# Patient Record
Sex: Female | Born: 1995 | Race: Black or African American | Hispanic: No | Marital: Single | Smoking: Current every day smoker
Health system: Southern US, Community
[De-identification: ages and names within clinical notes are randomized; demographics above are authoritative.]

## PROBLEM LIST (undated history)

## (undated) ENCOUNTER — Inpatient Hospital Stay: Payer: Self-pay

---
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
## 2013-05-08 ENCOUNTER — Observation Stay: Payer: Self-pay | Admitting: Obstetrics and Gynecology

## 2013-05-18 ENCOUNTER — Inpatient Hospital Stay: Payer: Self-pay | Admitting: Obstetrics and Gynecology

## 2013-05-18 LAB — DRUG SCREEN, URINE
Amphetamines, Ur Screen: NEGATIVE (ref ?–1000)
Barbiturates, Ur Screen: NEGATIVE (ref ?–200)
Benzodiazepine, Ur Scrn: NEGATIVE (ref ?–200)
Cannabinoid 50 Ng, Ur ~~LOC~~: POSITIVE (ref ?–50)
Cocaine Metabolite,Ur ~~LOC~~: NEGATIVE (ref ?–300)
MDMA (Ecstasy)Ur Screen: NEGATIVE (ref ?–500)
Methadone, Ur Screen: NEGATIVE (ref ?–300)
Opiate, Ur Screen: NEGATIVE (ref ?–300)
Phencyclidine (PCP) Ur S: NEGATIVE (ref ?–25)
Tricyclic, Ur Screen: NEGATIVE (ref ?–1000)

## 2013-05-18 LAB — CBC WITH DIFFERENTIAL/PLATELET
Basophil #: 0 10*3/uL (ref 0.0–0.1)
Basophil %: 0.2 %
Eosinophil #: 0 10*3/uL (ref 0.0–0.7)
Eosinophil %: 0.2 %
HCT: 30.4 % — ABNORMAL LOW (ref 35.0–47.0)
HGB: 9.8 g/dL — ABNORMAL LOW (ref 12.0–16.0)
Lymphocyte #: 1.6 10*3/uL (ref 1.0–3.6)
Lymphocyte %: 19.8 %
MCH: 27.1 pg (ref 26.0–34.0)
MCHC: 32.3 g/dL (ref 32.0–36.0)
MCV: 84 fL (ref 80–100)
Monocyte #: 0.6 x10 3/mm (ref 0.2–0.9)
Monocyte %: 8.2 %
Neutrophil #: 5.6 10*3/uL (ref 1.4–6.5)
Neutrophil %: 71.6 %
Platelet: 218 10*3/uL (ref 150–440)
RBC: 3.63 10*6/uL — ABNORMAL LOW (ref 3.80–5.20)
RDW: 14.4 % (ref 11.5–14.5)
WBC: 7.8 10*3/uL (ref 3.6–11.0)

## 2013-05-18 LAB — GC/CHLAMYDIA PROBE AMP

## 2013-05-19 LAB — HEMOGLOBIN: HGB: 8 g/dL — ABNORMAL LOW (ref 12.0–16.0)

## 2014-02-24 ENCOUNTER — Emergency Department: Payer: Self-pay | Admitting: Emergency Medicine

## 2014-02-25 ENCOUNTER — Emergency Department: Payer: Self-pay | Admitting: Emergency Medicine

## 2014-05-03 ENCOUNTER — Emergency Department: Payer: Self-pay | Admitting: Student

## 2014-06-09 LAB — OB RESULTS CONSOLE RUBELLA ANTIBODY, IGM: Rubella: IMMUNE

## 2014-06-09 LAB — OB RESULTS CONSOLE GC/CHLAMYDIA
Chlamydia: NEGATIVE
Gonorrhea: NEGATIVE

## 2014-06-09 LAB — OB RESULTS CONSOLE HIV ANTIBODY (ROUTINE TESTING): HIV: NONREACTIVE

## 2014-06-09 LAB — OB RESULTS CONSOLE ABO/RH: RH Type: POSITIVE

## 2014-06-09 LAB — OB RESULTS CONSOLE VARICELLA ZOSTER ANTIBODY, IGG: Varicella: IMMUNE

## 2014-06-09 LAB — OB RESULTS CONSOLE HEPATITIS B SURFACE ANTIGEN: Hepatitis B Surface Ag: NEGATIVE

## 2014-06-09 LAB — OB RESULTS CONSOLE ANTIBODY SCREEN: Antibody Screen: NEGATIVE

## 2014-06-15 ENCOUNTER — Ambulatory Visit
Admit: 2014-06-15 | Disposition: A | Discharge status: Home / Self Care | Payer: Self-pay | Attending: Family Medicine | Admitting: Family Medicine

## 2014-07-23 ENCOUNTER — Encounter: Payer: Self-pay | Admitting: Medical Oncology

## 2014-07-23 ENCOUNTER — Inpatient Hospital Stay
Admission: EM | Admit: 2014-07-23 | Discharge: 2014-07-28 | DRG: 781 | Disposition: A | Discharge status: Home / Self Care | Payer: Medicaid Other | Attending: Obstetrics and Gynecology | Admitting: Obstetrics and Gynecology

## 2014-07-23 ENCOUNTER — Emergency Department: Payer: Medicaid Other

## 2014-07-23 DIAGNOSIS — F1210 Cannabis abuse, uncomplicated: Secondary | ICD-10-CM | POA: Diagnosis present

## 2014-07-23 DIAGNOSIS — N12 Tubulo-interstitial nephritis, not specified as acute or chronic: Secondary | ICD-10-CM | POA: Diagnosis present

## 2014-07-23 DIAGNOSIS — O99012 Anemia complicating pregnancy, second trimester: Secondary | ICD-10-CM | POA: Diagnosis present

## 2014-07-23 DIAGNOSIS — O2302 Infections of kidney in pregnancy, second trimester: Secondary | ICD-10-CM | POA: Diagnosis not present

## 2014-07-23 DIAGNOSIS — O98212 Gonorrhea complicating pregnancy, second trimester: Secondary | ICD-10-CM | POA: Diagnosis present

## 2014-07-23 DIAGNOSIS — Z3490 Encounter for supervision of normal pregnancy, unspecified, unspecified trimester: Secondary | ICD-10-CM

## 2014-07-23 DIAGNOSIS — B9620 Unspecified Escherichia coli [E. coli] as the cause of diseases classified elsewhere: Secondary | ICD-10-CM | POA: Diagnosis present

## 2014-07-23 DIAGNOSIS — Z331 Pregnant state, incidental: Secondary | ICD-10-CM | POA: Diagnosis not present

## 2014-07-23 DIAGNOSIS — O99322 Drug use complicating pregnancy, second trimester: Secondary | ICD-10-CM | POA: Diagnosis present

## 2014-07-23 DIAGNOSIS — Z3A24 24 weeks gestation of pregnancy: Secondary | ICD-10-CM | POA: Diagnosis present

## 2014-07-23 LAB — CBC WITH DIFFERENTIAL/PLATELET
Basophils Absolute: 0 10*3/uL (ref 0–0.1)
Basophils Relative: 0 %
Eosinophils Absolute: 0 10*3/uL (ref 0–0.7)
Eosinophils Relative: 0 %
HCT: 31.3 % — ABNORMAL LOW (ref 35.0–47.0)
Hemoglobin: 10 g/dL — ABNORMAL LOW (ref 12.0–16.0)
Lymphocytes Relative: 3 %
Lymphs Abs: 0.5 10*3/uL — ABNORMAL LOW (ref 1.0–3.6)
MCH: 26.6 pg (ref 26.0–34.0)
MCHC: 32 g/dL (ref 32.0–36.0)
MCV: 83 fL (ref 80.0–100.0)
Monocytes Absolute: 1 10*3/uL — ABNORMAL HIGH (ref 0.2–0.9)
Monocytes Relative: 6 %
Neutro Abs: 15.7 10*3/uL — ABNORMAL HIGH (ref 1.4–6.5)
Neutrophils Relative %: 91 %
Platelets: 210 10*3/uL (ref 150–440)
RBC: 3.77 MIL/uL — ABNORMAL LOW (ref 3.80–5.20)
RDW: 13.5 % (ref 11.5–14.5)
WBC: 17.4 10*3/uL — ABNORMAL HIGH (ref 3.6–11.0)

## 2014-07-23 LAB — URINALYSIS COMPLETE WITH MICROSCOPIC (ARMC ONLY)
Bilirubin Urine: NEGATIVE
Glucose, UA: 50 mg/dL — AB
Ketones, ur: NEGATIVE mg/dL
Nitrite: NEGATIVE
Protein, ur: 30 mg/dL — AB
Specific Gravity, Urine: 1.004 — ABNORMAL LOW (ref 1.005–1.030)
pH: 7 (ref 5.0–8.0)

## 2014-07-23 LAB — COMPREHENSIVE METABOLIC PANEL
ALT: 11 U/L — ABNORMAL LOW (ref 14–54)
AST: 21 U/L (ref 15–41)
Albumin: 2.9 g/dL — ABNORMAL LOW (ref 3.5–5.0)
Alkaline Phosphatase: 122 U/L (ref 38–126)
Anion gap: 10 (ref 5–15)
BUN: 5 mg/dL — ABNORMAL LOW (ref 6–20)
CO2: 22 mmol/L (ref 22–32)
Calcium: 8.6 mg/dL — ABNORMAL LOW (ref 8.9–10.3)
Chloride: 96 mmol/L — ABNORMAL LOW (ref 101–111)
Creatinine, Ser: 0.8 mg/dL (ref 0.44–1.00)
GFR calc Af Amer: >60 mL/min (ref >60)
GFR calc non Af Amer: >60 mL/min (ref >60)
Glucose, Bld: 142 mg/dL — ABNORMAL HIGH (ref 65–99)
Potassium: 3.1 mmol/L — ABNORMAL LOW (ref 3.5–5.1)
Sodium: 128 mmol/L — ABNORMAL LOW (ref 135–145)
Total Bilirubin: 0.7 mg/dL (ref 0.3–1.2)
Total Protein: 7.6 g/dL (ref 6.5–8.1)

## 2014-07-23 LAB — LACTIC ACID, PLASMA: Lactic Acid, Venous: 1.9 mmol/L (ref 0.5–2.0)

## 2014-07-23 MED ORDER — FENTANYL CITRATE (PF) 100 MCG/2ML IJ SOLN
50.0000 ug | Freq: Once | INTRAMUSCULAR | Status: AC
  Administered 2014-07-23: 50 ug via INTRAVENOUS

## 2014-07-23 MED ORDER — ACETAMINOPHEN 325 MG PO TABS
ORAL_TABLET | ORAL | Status: AC
  Filled 2014-07-23: qty 2

## 2014-07-23 MED ORDER — POTASSIUM CHLORIDE CRYS ER 20 MEQ PO TBCR
40.0000 meq | EXTENDED_RELEASE_TABLET | Freq: Once | ORAL | Status: AC
  Administered 2014-07-23: 40 meq via ORAL

## 2014-07-23 MED ORDER — CEFTRIAXONE SODIUM IN DEXTROSE 20 MG/ML IV SOLN
INTRAVENOUS | Status: AC
  Administered 2014-07-23: 2000 mg
  Filled 2014-07-23: qty 100

## 2014-07-23 MED ORDER — FENTANYL CITRATE (PF) 100 MCG/2ML IJ SOLN
INTRAMUSCULAR | Status: AC
  Administered 2014-07-23: 50 ug via INTRAVENOUS
  Filled 2014-07-23: qty 2

## 2014-07-23 MED ORDER — CEFTRIAXONE SODIUM IN DEXTROSE 40 MG/ML IV SOLN: 2.0000 g | Freq: Once | INTRAVENOUS | Status: AC

## 2014-07-23 MED ORDER — SODIUM CHLORIDE 0.9 % IV SOLN
Freq: Once | INTRAVENOUS | Status: AC
  Administered 2014-07-23: 17:00:00 via INTRAVENOUS

## 2014-07-23 MED ORDER — ACETAMINOPHEN 325 MG PO TABS
650.0000 mg | ORAL_TABLET | Freq: Four times a day (QID) | ORAL | Status: DC | PRN
  Administered 2014-07-23 – 2014-07-27 (×6): 650 mg via ORAL
  Filled 2014-07-23 (×4): qty 2

## 2014-07-23 MED ORDER — PRENATAL MULTIVITAMIN CH
1.0000 | ORAL_TABLET | Freq: Every day | ORAL | Status: DC
  Administered 2014-07-24 – 2014-07-27 (×3): 1 via ORAL
  Filled 2014-07-23 (×3): qty 1

## 2014-07-23 MED ORDER — HYDROMORPHONE HCL 1 MG/ML IJ SOLN
1.0000 mg | Freq: Once | INTRAMUSCULAR | Status: AC
  Administered 2014-07-23: 1 mg via INTRAVENOUS

## 2014-07-23 MED ORDER — ACETAMINOPHEN 500 MG PO TABS
ORAL_TABLET | ORAL | Status: AC
  Administered 2014-07-23: 1000 mg via ORAL
  Filled 2014-07-23: qty 2

## 2014-07-23 MED ORDER — HYDROMORPHONE HCL 1 MG/ML IJ SOLN
INTRAMUSCULAR | Status: AC
  Administered 2014-07-23: 1 mg via INTRAVENOUS
  Filled 2014-07-23: qty 1

## 2014-07-23 MED ORDER — ACETAMINOPHEN 500 MG PO TABS
1000.0000 mg | ORAL_TABLET | Freq: Once | ORAL | Status: AC
  Administered 2014-07-23 – 2014-07-24 (×2): 1000 mg via ORAL

## 2014-07-23 MED ORDER — DOCUSATE SODIUM 100 MG PO CAPS
100.0000 mg | ORAL_CAPSULE | Freq: Two times a day (BID) | ORAL | Status: DC | PRN
  Administered 2014-07-25 – 2014-07-27 (×6): 100 mg via ORAL
  Filled 2014-07-23 (×7): qty 1

## 2014-07-23 MED ORDER — POTASSIUM CHLORIDE CRYS ER 20 MEQ PO TBCR
EXTENDED_RELEASE_TABLET | ORAL | Status: AC
  Administered 2014-07-23: 40 meq via ORAL
  Filled 2014-07-23: qty 2

## 2014-07-23 MED ORDER — CALCIUM CARBONATE ANTACID 500 MG PO CHEW
2.0000 | CHEWABLE_TABLET | ORAL | Status: DC | PRN
  Administered 2014-07-24 – 2014-07-25 (×4): 400 mg via ORAL
  Filled 2014-07-23 (×4): qty 2

## 2014-07-23 MED ORDER — POTASSIUM CHLORIDE 2 MEQ/ML IV SOLN
INTRAVENOUS | Status: DC
  Administered 2014-07-23 – 2014-07-26 (×8): via INTRAVENOUS
  Filled 2014-07-23 (×18): qty 1000

## 2014-07-23 NOTE — ED Notes (Signed)
Patient transported to Ultrasound 

## 2014-07-23 NOTE — ED Provider Notes (Signed)
[facility] Emergency Department Provider Note  ____________________________________________  Time seen: 1615  I have reviewed the triage vital signs and the nursing notes.   HISTORY  Chief Complaint Flank Pain and Fever    HPI Alicia Costa is a 19 y.o. female who is [redacted] weeks pregnant. She began having pain in her right flank 2-3 days ago. She is having fevers. The pain is getting worse and worse and is now rather severe.  She denies any abdominal pain. [facility]. She does complain of diffuse myalgias and pain in her lower extremities.    She has report having had simple UTIs in the past but never a kidney infection.    History reviewed. No pertinent past medical history.  There are no active problems to display for this patient.   History reviewed. No pertinent past surgical history.  No current outpatient prescriptions on file.  Allergies Review of patient's allergies indicates no known allergies.  No family history on file.  Social History History  Substance Use Topics  . Smoking status: Never Smoker   . Smokeless tobacco: Not on file  . Alcohol Use: No    Review of Systems  Constitutional: Positive for fever. ENT: Negative for sore throat. Cardiovascular: Negative for chest pain. Respiratory: Negative for shortness of breath. Gastrointestinal: Negative for abdominal pain, vomiting and diarrhea. Genitourinary: No dysuria. Patient is gravid at 24 weeks. See history of present illness. Musculoskeletal: Positive for right flank pain. She does have myalgias in her legs. See history of present illness Skin: Negative for rash. Neurological: Negative for headaches   10-point ROS otherwise negative.  ____________________________________________   PHYSICAL EXAM:  VITAL SIGNS: ED Triage Vitals  Enc Vitals Group     BP 07/23/14 1545 111/61 mmHg     Pulse Rate 07/23/14 1545 138   Resp 07/23/14 1545 18     Temp 07/23/14 1545 102.7 F (39.3 C)     Temp Source 07/23/14 1545 Oral     SpO2 07/23/14 1545 99 %     Weight 07/23/14 1545 154 lb (69.854 kg)     Height 07/23/14 1545 5\' 8"  (1.727 m)     Head Cir --      Peak Flow --      Pain Score 07/23/14 1546 8     Pain Loc --      Pain Edu? --      Excl. in GC? --     Constitutional: Alert and oriented. Afebrile to touch. Moderate discomfort area ENT   Head: Normocephalic and atraumatic.   Nose: No congestion/rhinnorhea.   Mouth/Throat: Mucous membranes are moist. Cardiovascular: Tachycardic at 140s Respiratory: Normal respiratory effort without tachypnea. Breath sounds are clear and equal bilaterally. No wheezes/rales/rhonchi. Gastrointestinal: Gravid. Nontender..  Back: Severe and exquisite CVA tenderness on the right. No tenderness to light palpation. Musculoskeletal: Nontender with normal range of motion in all extremities.  No noted edema. Neurologic:  Normal speech and language. No gross focal neurologic deficits are appreciated.  Skin:  Skin is warm, appears febrile, moist.. No rash noted. Psychiatric: Mood and affect are normal. Speech and behavior are normal.  ____________________________________________    LABS (pertinent positives/negatives)  White blood cell count elevated at 17.4 hemoglobin of 10.0 Sodium 128, potassium 3.1, BUN 5, creatinine 0.8 Urinalysis shows white blood cells 6-30 with 2+ leukocyte esterase. No red blood cells. Urine culture pending.  ____________________________________________   EKG  ED ECG REPORT  I, Danzell Birky W, the attending physician, personally viewed and interpreted this ECG.   Date: 07/23/2014  EKG Time: 1555  Rate: 141  Rhythm: Sinus tachycardia  Axis: Rightward axis  Intervals: Normal  ST&T Change: Nonspecific and likely related to rate.   ____________________________________________    RADIOLOGY  Renal ultrasound: IMPRESSION: 1.  Heterogeneous areas of increased parenchymal echogenicity in the right kidney. This may be due to pyelonephritis. 2. Relatively large kidneys, which may be a normal variant in this patient. 3. No hydronephrosis  ____________________________________________   PROCEDURES  Critical Care performed:  CRITICAL CARE Performed by: Darien Ramus   Total critical care time: 40 minutes due to the critical condition of this patient with suspected pyelonephritis, fever, elevated white blood cell count, requiring IV antibiotics and consultation with gynecology and urology.  Critical care time was exclusive of separately billable procedures and treating other patients.  Critical care was necessary to treat or prevent imminent or life-threatening deterioration.  Critical care was time spent personally by me on the following activities: development of treatment plan with patient and/or surrogate as well as nursing, discussions with consultants, evaluation of patient's response to treatment, examination of patient, obtaining history from patient or surrogate, ordering and performing treatments and interventions, ordering and review of laboratory studies, ordering and review of radiographic studies, pulse oximetry and re-evaluation of patient's condition.   ____________________________________________   INITIAL IMPRESSION / ASSESSMENT AND PLAN / ED COURSE  This patient is febrile with notable right CVA tenderness. Most likely she has right-sided pyelonephritis. We will empirically treat her with ceftriaxone. We will obtain a renal ultrasound and avoid CT at this time due to her pregnancy. She is getting a liter of normal saline currently and we will treat her pain with fentanyl.  ----------------------------------------- 8:07 PM on 07/23/2014 -----------------------------------------  Recheck a patient. She appears to be very uncomfortable. She is crying out and moaning with pain. She did get  relief from the fentanyl IV, but of course that a short acting and has worn off. We will treat her with Dilaudid for longer duration of pain control.  The ultrasound does show parenchymal changes consistent with pyelonephritis, but the urinalysis is not particularly bad. She only has 6-30 white blood cells per field. She does have an elevated serum white blood cell count.  This is discussed with Dr. Vergie Living of GYN. He will admit the patient. He has asked that we send the patient to labor and delivery so the baby can be evaluated as well.  ____________________________________________   FINAL CLINICAL IMPRESSION(S) / ED DIAGNOSES  Final diagnoses:  Pyelonephritis  Pregnancy      Darien Ramus, MD 07/23/14 2027

## 2014-07-23 NOTE — ED Notes (Signed)
Started rocephin at this time. Used 2 1gram bags

## 2014-07-23 NOTE — ED Notes (Signed)
Pt reports 2 days ago she began having rt sided flank pain along with fever and chills. Denies dysuria. Also is [redacted] weeks pregnant.

## 2014-07-23 NOTE — ED Notes (Signed)
Pharmacy notified to send LR with KCl

## 2014-07-23 NOTE — ED Notes (Signed)
Returned from U/S

## 2014-07-24 ENCOUNTER — Encounter: Payer: Self-pay | Admitting: *Deleted

## 2014-07-24 LAB — URINE DRUG SCREEN, QUALITATIVE (ARMC ONLY)
Amphetamines, Ur Screen: NOT DETECTED
Barbiturates, Ur Screen: NOT DETECTED
Benzodiazepine, Ur Scrn: NOT DETECTED
Cannabinoid 50 Ng, Ur ~~LOC~~: POSITIVE — AB
Cocaine Metabolite,Ur ~~LOC~~: NOT DETECTED
MDMA (Ecstasy)Ur Screen: NOT DETECTED
Methadone Scn, Ur: NOT DETECTED
Opiate, Ur Screen: NOT DETECTED
Phencyclidine (PCP) Ur S: NOT DETECTED
Tricyclic, Ur Screen: NOT DETECTED

## 2014-07-24 LAB — CHLAMYDIA/NGC RT PCR (ARMC ONLY)
Chlamydia Tr: NOT DETECTED
N gonorrhoeae: DETECTED

## 2014-07-24 MED ORDER — AZITHROMYCIN 500 MG PO TABS
2000.0000 mg | ORAL_TABLET | Freq: Once | ORAL | Status: AC
  Administered 2014-07-24: 2000 mg via ORAL
  Filled 2014-07-24: qty 4

## 2014-07-24 MED ORDER — OXYCODONE-ACETAMINOPHEN 5-325 MG PO TABS
ORAL_TABLET | ORAL | Status: AC
  Administered 2014-07-24: 1 via ORAL
  Filled 2014-07-24: qty 1

## 2014-07-24 MED ORDER — CEFTRIAXONE SODIUM IN DEXTROSE 20 MG/ML IV SOLN: 1.0000 g | INTRAVENOUS | Status: DC

## 2014-07-24 MED ORDER — ACETAMINOPHEN 325 MG PO TABS
ORAL_TABLET | ORAL | Status: AC
  Administered 2014-07-24: 650 mg via ORAL
  Filled 2014-07-24: qty 2

## 2014-07-24 MED ORDER — PRENATAL PLUS 27-1 MG PO TABS
ORAL_TABLET | ORAL | Status: AC
  Administered 2014-07-24: 1 via ORAL
  Filled 2014-07-24: qty 1

## 2014-07-24 MED ORDER — SODIUM CHLORIDE 0.9 % IJ SOLN
INTRAMUSCULAR | Status: AC
  Administered 2014-07-24: 02:00:00
  Filled 2014-07-24: qty 3

## 2014-07-24 MED ORDER — ACETAMINOPHEN 500 MG PO TABS: 1000.0000 mg | ORAL_TABLET | Freq: Four times a day (QID) | ORAL | Status: DC | PRN

## 2014-07-24 MED ORDER — CEFTRIAXONE SODIUM IN DEXTROSE 20 MG/ML IV SOLN
1.0000 g | INTRAVENOUS | Status: DC
  Administered 2014-07-24 – 2014-07-26 (×3): 1 g via INTRAVENOUS
  Filled 2014-07-24 (×7): qty 50

## 2014-07-24 MED ORDER — OXYCODONE-ACETAMINOPHEN 5-325 MG PO TABS
1.0000 | ORAL_TABLET | ORAL | Status: DC | PRN
  Administered 2014-07-24 (×4): 1 via ORAL
  Filled 2014-07-24: qty 1
  Filled 2014-07-24: qty 2
  Filled 2014-07-24: qty 1

## 2014-07-24 MED ORDER — ACETAMINOPHEN 500 MG PO TABS
ORAL_TABLET | ORAL | Status: AC
  Administered 2014-07-24: 1000 mg via ORAL
  Filled 2014-07-24: qty 2

## 2014-07-24 MED ORDER — SODIUM CHLORIDE 0.9 % IJ SOLN
INTRAMUSCULAR | Status: AC
  Filled 2014-07-24: qty 3

## 2014-07-24 MED ORDER — LACTATED RINGERS IV BOLUS (SEPSIS): 1000.0000 mL | Freq: Once | INTRAVENOUS | Status: DC

## 2014-07-24 MED ORDER — LACTATED RINGERS IV BOLUS (SEPSIS)
1000.0000 mL | Freq: Once | INTRAVENOUS | Status: AC
  Administered 2014-07-24: 1000 mL via INTRAVENOUS

## 2014-07-24 MED ORDER — AZITHROMYCIN 1 G PO PACK
2.0000 g | PACK | Freq: Once | ORAL | Status: DC
  Filled 2014-07-24: qty 2

## 2014-07-24 NOTE — Progress Notes (Addendum)
Urine culture shows Gonorrhea. Covered by Rocephin IV (for pyelo). Will treat w dual therapy by adding Azithromycin 2g po dose once. Advised on STD risks.

## 2014-07-24 NOTE — ED Notes (Signed)
Rcvd Call from microbiology: 2 Blood Cultures Positive for Gram Neg Rods and U Culture >100,000 Gram Neg Rods. Critical Results called to Dr. Tiburcio PeaHarris @ 0930

## 2014-07-24 NOTE — H&P (Addendum)
Obstetrics Admission History & Physical  Primary OBGYN: Phineas Real  Chief Complaint: right sided back pain x 3 days  History of Present Illness  18 y.o. G2P1001 @ 24/0 (EDC 9/17, dated by 18 wk u/s) with the above CC. Pregnancy complicated by: THC use.  Occasional fevers and chills and missed last PNV on Thursday and came in to ER instead of rescheduling.  Renal U/s negative and started on rocephin in the ER.   No PTL s/s or decreased FM, chest pain, SOB, dysuria, hematuria, nausea, vomiting  Review of Systems:  her 12 point review of systems is negative or as noted in the History of Present Illness.  PMHx: History reviewed. No pertinent past medical history. PSHx: History reviewed. No pertinent past surgical history. Medications:  No prescriptions prior to admission    Allergies: has No Known Allergies. OBHx:  TSVD 04/2013, no issues or complications during delivery or pregnancy, per patient. Patient denies any issues during this pregnancy GYNHx:  History of STIs: no         FHx: mom with "cervical cancer" Soc Hx: FOB is involved and they are on good terms Smokes THC for nausea s/s Not working  Objective   Patient Vitals for the past 8 hrs:  BP Temp Temp src Pulse Resp Height Weight  07/24/14 0733 (!) 98/53 mmHg 99 F (37.2 C) Oral (!) 123 (!) 22  (1.727 m) 154 lb (69.854 kg)  07/24/14 0529 (!) 120/58 mmHg - - (!) 144 - - -  07/24/14 0524 (!) 213/199 mmHg - - (!) 142 - - -  07/24/14 0514 (!) 197/180 mmHg - - 89 - - -  07/24/14 0510 - (!) 101.4 F (38.6 C) Oral - - - -  07/24/14 0115 (!) 98/45 mmHg 98.4 F (36.9 C) Oral (!) 111 16 - -   FHR: 150s  Toco: negative  General: Well nourished, well developed female in no acute distress.  Skin:  Warm and dry.  Cardiovascular: Regular rate and rhythm. Respiratory:  Clear to auscultation bilateral. Normal respiratory effort Abdomen: gravid, nttp Back: no CVAT bilaterally. Pt points to some right LBP Neuro/Psych:   Normal mood and affect.   Recent Labs Lab 07/23/14 1603  WBC 17.4*  HGB 10.0*  HCT 31.3*  PLT 210    Recent Labs Lab 07/23/14 1603  NA 128*  K 3.1*  CL 96*  CO2 22  BUN 5*  CREATININE 0.80  CALCIUM 8.6*  PROT 7.6  BILITOT 0.7  ALKPHOS 122  ALT 11*  AST 21  GLUCOSE 142*   Pending GC/CT (urine) UDS +THC UCx, BCx x 2 pending  Radiology CLINICAL DATA: Flank pain for 3 days with fever. Pain is greater on the right than the left. Pregnant patient.  EXAM: RENAL / URINARY TRACT ULTRASOUND COMPLETE  COMPARISON: None.  FINDINGS: Right Kidney:  Length: 13.6 cm. There is heterogeneous increased parenchymal echogenicity. No renal masses or stones. No hydronephrosis.  Left Kidney:  Length: 14.0 cm. Normal parenchymal echogenicity. No mass or stone. No hydronephrosis.  Bladder:  Appears normal for degree of bladder distention.  IMPRESSION: 1. Heterogeneous areas of increased parenchymal echogenicity in the right kidney. This may be due to pyelonephritis. 2. Relatively large kidneys, which may be a normal variant in this patient. 3. No hydronephrosis.   Electronically Signed  By: Amie Portland M.D.  On: 07/23/2014 19:18  Assessment & Plan  Right sided pyelo; pt improving *IUP: fetal status reassuring qshift FHRs *ID: continue on rocephin,  currently D#2. Follow up UCx, BCx *FEN/GI: continue with MIVF with potassium and given k-dur x 1. S/p 1LR bolus in ER and will repeat. Repeat BMP to recheck electrolytes *UDS: pt counseled on adverse outcomes with THC use and that it actually makes GI s/s worse *PPx: OOB ad lib. *Analgesia: PO prns *Dispo: with improving s/s. Okay to transfer to AP status  Cornelia Copaharlie Elgin Carn, Jr. MD [facility] Pager [phone]

## 2014-07-24 NOTE — Discharge Summary (Signed)
Discharge Summary   Admit Date: 07/23/2014 Discharge Date: 07/28/2014 Discharging Service: Antepartum  Primary [facility]YN[facility] Admitting Physician: Provencal Bingharlie Pickens, MD  Discharge Physician: Ranae Plumberhelsea Calley Drenning, MD  Referring Provider: ER  Primary Care Provider: No primary care provider on file.  Admission Diagnoses (Primary):  *Intrauterine pregnancy @ 24/0 weeks *Right sided pyleonephritis  Discharge Diagnoses (Primary):  Same  Consult Orders: None   Surgeries/Procedures Performed: None  History and Physical: 19 y/o G2P1001 @ 24/0 with above CC and Preg c/b THC use. 3 days worth of s/s. Negative ER renal u/s and started on rocephin day of admission. Fetus and patient's HR responded to IVF boluses. Temp to 102 in ER on admission that came down with abx and tylenol.  UDS +THC.    Recent Labs Lab 07/23/14 1603 07/26/14 1040  NA 128* 137  K 3.1* 3.6  CL 96* 108  CO2 22 20*  BUN 5* <5*  CREATININE 0.80 0.73  CALCIUM 8.6* 8.6*  PROT 7.6  --   BILITOT 0.7  --   ALKPHOS 122  --   ALT 11*  --   AST 21  --   GLUCOSE 142* 138*    Recent Labs Lab 07/26/14 1040  WBC 11.2*  HGB 9.5*  HCT 29.5*  PLT 260   Hospital Course: Admitted to floor for IV abx and PO pain control.  On HD #5 changed to PO Keflex, and remained afebrile, minimal discomfort.    Discharge Exam:  Gen: NAD CV: RRR no MRG Pulm: CTABL, nl effort Abd: gravid, S/ND/NT Back: no CVAttp LE: no edema, ttp  Discharge Disposition:  Stable, to home  Patient Instructions:  Keep next appointment, take antibiotics 4x per day for 10 days, then once a day for the rest of your pregnancy  Results Pending at Discharge:  none  Discharge Medications:   Medication List    TAKE these medications        acetaminophen 500 MG tablet  Commonly known as:  TYLENOL  1-2 tabs every 4-6 hours as needed for pain     cephALEXin 500 MG capsule  Commonly known as:  KEFLEX  Take 1 capsule (500  mg total) by mouth every 6 (six) hours.     cephALEXin 500 MG capsule  Commonly known as:  KEFLEX  Take 1 capsule (500 mg total) by mouth 4 (four) times daily.     clotrimazole 2 % vaginal cream  Commonly known as:  GYNE-LOTRIMIN 3  Place 1 Applicatorful vaginally at bedtime.     ondansetron 4 MG tablet  Commonly known as:  ZOFRAN  Take 1 tablet (4 mg total) by mouth every 8 (eight) hours as needed for nausea or vomiting.     prenatal multivitamin Tabs tablet  Take 1 tablet by mouth daily at 12 noon.         Ranae Plumberhelsea Kinda Pottle, MD Attending Obstetrician and Gynecologist [facility]

## 2014-07-24 NOTE — OB Triage Note (Signed)
Patient presents from ED after being seen and treated for pylenephritis.

## 2014-07-25 LAB — CBC WITH DIFFERENTIAL/PLATELET
Basophils Absolute: 0 10*3/uL (ref 0–0.1)
Basophils Relative: 0 %
Eosinophils Absolute: 0 10*3/uL (ref 0–0.7)
Eosinophils Relative: 0 %
HCT: 27.6 % — ABNORMAL LOW (ref 35.0–47.0)
Hemoglobin: 8.9 g/dL — ABNORMAL LOW (ref 12.0–16.0)
Lymphocytes Relative: 6 %
Lymphs Abs: 0.6 10*3/uL — ABNORMAL LOW (ref 1.0–3.6)
MCH: 26.9 pg (ref 26.0–34.0)
MCHC: 32.4 g/dL (ref 32.0–36.0)
MCV: 83 fL (ref 80.0–100.0)
Monocytes Absolute: 1.2 10*3/uL — ABNORMAL HIGH (ref 0.2–0.9)
Monocytes Relative: 12 %
Neutro Abs: 8.4 10*3/uL — ABNORMAL HIGH (ref 1.4–6.5)
Neutrophils Relative %: 82 %
Platelets: 181 10*3/uL (ref 150–440)
RBC: 3.32 MIL/uL — ABNORMAL LOW (ref 3.80–5.20)
RDW: 14.2 % (ref 11.5–14.5)
WBC: 10.3 10*3/uL (ref 3.6–11.0)

## 2014-07-25 MED ORDER — ONDANSETRON HCL 4 MG/2ML IJ SOLN
4.0000 mg | INTRAMUSCULAR | Status: DC | PRN
  Administered 2014-07-25 (×2): 4 mg via INTRAVENOUS
  Filled 2014-07-25 (×3): qty 2

## 2014-07-25 MED ORDER — SIMETHICONE 80 MG PO CHEW
80.0000 mg | CHEWABLE_TABLET | Freq: Four times a day (QID) | ORAL | Status: DC | PRN
  Administered 2014-07-25 – 2014-07-26 (×6): 80 mg via ORAL
  Filled 2014-07-25 (×8): qty 1

## 2014-07-25 NOTE — Progress Notes (Signed)
Pt overall feels better. Still complains of tenderness over flank area and feeling full in her abdomen. Pt taking tums, simethicone, stool softener as ordered. Pt ambulating in room, +flatus, and burping. Pt tachy at times. Incentive spirometer teaching completed today. SCD's in place. PO intake adequate, output adequate as well.

## 2014-07-25 NOTE — Progress Notes (Signed)
Benign Gynecology Progress Note  Admission Date: 07/23/2014 Current Date: 07/25/2014  Alicia Costa is a 19 y.o. G1P0 HD#2 @ 2773w3d pregnancy with Pyelo/Gonorrhea .  History complicated by: Patient Active Problem List   Diagnosis Date Noted  . Pyelonephritis affecting pregnancy in second trimester, antepartum 07/23/2014   ROS and patient/family/surgical history, located on admission H&P note dated 07/23/2014, have been reviewed, and there are no changes except as noted below  Yesterday/Overnight Events:  Pain in abd, less so in back.  Sometimes feels chest pressure but says this stems from abd pain first.    Subjective:  BM yesterday.  +diet.  Some nausea this am.  Objective:  Tmax 103 yesterday 1600  Filed Vitals:   07/25/14 0406 07/25/14 0446 07/25/14 0744 07/25/14 0928  BP: 115/61  101/61 128/84  Pulse: 121  102 126  Temp: 99.9 F (37.7 C) 99.2 F (37.3 C) 98.1 F (36.7 C) 98.7 F (37.1 C)  TempSrc: Oral Oral Oral Oral  Resp: 22  20 18   Height:      Weight:      SpO2: 96%  97% 100%   Temp:  [98.1 F (36.7 C)-103.1 F (39.5 C)] 98.7 F (37.1 C) (05/29 0928) Pulse Rate:  [102-140] 126 (05/29 0928) Resp:  [16-24] 18 (05/29 0928) BP: (101-132)/(61-89) 128/84 mmHg (05/29 0928) SpO2:  [96 %-100 %] 100 % (05/29 0928) I/O last 3 completed shifts: In: 1850 [P.O.:800; I.V.:1050] Out: 2002 [Urine:2000; Emesis/NG output:1; Stool:1]    Intake/Output Summary (Last 24 hours) at 07/25/14 0949 Last data filed at 07/25/14 0400  Gross per 24 hour  Intake   1050 ml  Output   2002 ml  Net   -952 ml     Current Vital Signs 24h Vital Sign Ranges  T 98.7 F (37.1 C) Temp  Avg: 99.4 F (37.4 C)  Min: 98.1 F (36.7 C)  Max: 103.1 F (39.5 C)  BP 128/84 mmHg BP  Min: 101/61  Max: 132/89  HR (!) 126 Pulse  Avg: 122  Min: 102  Max: 140  RR 18 Resp  Avg: 19.7  Min: 16  Max: 24  SaO2 100 % Not Delivered SpO2  Avg: 99 %  Min: 96 %  Max: 100 %           24 Hour I/O  Current Shift I/O  Time Ins Outs 05/28 0701 - 05/29 0700 In: 1050 [I.V.:1050] Out: 2002 [Urine:2000]      Physical exam: General appearance: alert, cooperative and appears stated age Abdomen: min distended.  NABS.  FHT 160s.  No rebound or guarding, min T. GU: No gross VB Lungs: clear to auscultation bilaterally Heart: rapid rate and reg rhythm Extremities: no redness or tenderness in the calves or thighs, no edema Skin: no lesions Psych: appropriate  Labs:    Recent Labs Lab 07/23/14 1603  NA 128*  K 3.1*  CL 96*  CO2 22  BUN 5*  CREATININE 0.80  GLUCOSE 142*    Recent Labs Lab 07/23/14 1603  WBC 17.4*  HGB 10.0*  HCT 31.3*  PLT 210     Recent Labs Lab 07/23/14 1603  CALCIUM 8.6*   No results for input(s): INR, APTT in the last 168 hours.     Recent Labs Lab 07/23/14 1603  ALKPHOS 122  BILITOT 0.7  PROT 7.6  ALT 11*  AST 21     Recent Labs Lab 07/23/14 1603  WBC 17.4*  HGB 10.0*  HCT 31.3*  PLT  210    Assessment & Plan:  24 week pregnancy, Pyelo, Gonorrhea  *GYN: Azithromycin given and also on Ceftriaxone.    Monitor for fever and pain.  WBC today.  IS.  Consider f/u US if sx's dont improve tomorrow. *Pain mgt: as needed *FEN/GI: monitor GI distension sx's as she reports them.  BM+.  Simethicone may help. *Resp: IS.  Monitor, no s/sx pneumonia currently.  Risks of pyelo leading to further infection discussed.

## 2014-07-26 ENCOUNTER — Inpatient Hospital Stay: Payer: Medicaid Other

## 2014-07-26 LAB — BASIC METABOLIC PANEL
Anion gap: 9 (ref 5–15)
BUN: <5 mg/dL — ABNORMAL LOW (ref 6–20)
CO2: 20 mmol/L — ABNORMAL LOW (ref 22–32)
Calcium: 8.6 mg/dL — ABNORMAL LOW (ref 8.9–10.3)
Chloride: 108 mmol/L (ref 101–111)
Creatinine, Ser: 0.73 mg/dL (ref 0.44–1.00)
GFR calc Af Amer: >60 mL/min (ref >60)
GFR calc non Af Amer: >60 mL/min (ref >60)
Glucose, Bld: 138 mg/dL — ABNORMAL HIGH (ref 65–99)
Potassium: 3.6 mmol/L (ref 3.5–5.1)
Sodium: 137 mmol/L (ref 135–145)

## 2014-07-26 LAB — URINE CULTURE: Culture: >=100000

## 2014-07-26 LAB — CBC WITH DIFFERENTIAL/PLATELET
Basophils Absolute: 0 10*3/uL (ref 0–0.1)
Basophils Relative: 0 %
Eosinophils Absolute: 0 10*3/uL (ref 0–0.7)
Eosinophils Relative: 0 %
HCT: 29.5 % — ABNORMAL LOW (ref 35.0–47.0)
Hemoglobin: 9.5 g/dL — ABNORMAL LOW (ref 12.0–16.0)
Lymphocytes Relative: 6 %
Lymphs Abs: 0.6 10*3/uL — ABNORMAL LOW (ref 1.0–3.6)
MCH: 26.5 pg (ref 26.0–34.0)
MCHC: 32.2 g/dL (ref 32.0–36.0)
MCV: 82.2 fL (ref 80.0–100.0)
Monocytes Absolute: 0.9 10*3/uL (ref 0.2–0.9)
Monocytes Relative: 8 %
Neutro Abs: 9.6 10*3/uL — ABNORMAL HIGH (ref 1.4–6.5)
Neutrophils Relative %: 86 %
Platelets: 260 10*3/uL (ref 150–440)
RBC: 3.59 MIL/uL — ABNORMAL LOW (ref 3.80–5.20)
RDW: 14.4 % (ref 11.5–14.5)
WBC: 11.2 10*3/uL — ABNORMAL HIGH (ref 3.6–11.0)

## 2014-07-26 MED ORDER — CLOTRIMAZOLE 2 % VA CREA
1.0000 | TOPICAL_CREAM | Freq: Every day | VAGINAL | Status: DC
  Administered 2014-07-26 – 2014-07-27 (×2): 1 via VAGINAL
  Filled 2014-07-26: qty 21

## 2014-07-26 NOTE — Progress Notes (Addendum)
Daily Antepartum Note  Admission Date: 07/23/2014 Current Date: 07/26/2014  Alicia Costa is a 19 y.o. G1 @ [redacted]w[redacted]d by 18wk u/s, HD#3 admitted for right sided pyelo   Pregnancy complicated by: THC use  24hr/Overnight events:  Temps overnight that came down with tylenol; unclear if overnight MD was notified   Subjective:  Currently no fevers, chills, chest pain, PTL s/s. Pt just very emotional with new diagnoses   Objective:    Current Vital Signs 24h Vital Sign Ranges  T 98.8 F (37.1 C) Temp  Avg: 99.1 F (37.3 C)  Min: 97.8 F (36.6 C)  Max: 100.4 F (38 C)  BP 136/90 mmHg BP  Min: 116/62  Max: 136/90  HR (!) 121 Pulse  Avg: 120.1  Min: 94  Max: 129  RR 20 Resp  Avg: 19.1  Min: 18  Max: 20  SaO2 97 % Not Delivered SpO2  Avg: 98.3 %  Min: 97 %  Max: 100 %       24 Hour I/O Current Shift I/O  Time Ins Outs 05/29 0701 - 05/30 0700 In: 4201.5 [P.O.:720; I.V.:3431.5] Out: 4500 [Urine:4500]     Patient Vitals for the past 12 hrs:  BP Temp Temp src Pulse Resp SpO2  07/26/14 0830 - 98.8 F (37.1 C) Oral - - -  07/26/14 0732 136/90 mmHg 100.3 F (37.9 C) Oral (!) 121 - 97 %  07/26/14 0504 124/81 mmHg 97.8 F (36.6 C) Oral 94 20 97 %  07/26/14 0155 - 99.1 F (37.3 C) Oral - - -  07/26/14 0049 131/83 mmHg (!) 100.4 F (38 C) Oral (!) 128 18 98 %   Physical exam: Emotional and crying RRR, No MRGs. HR 100s CTAB Back: no CVAT No c/c/e Abd: gravid, NTTP  Medications: Current Facility-Administered Medications  Medication Dose Route Frequency Provider Last Rate Last Dose  . acetaminophen (TYLENOL) tablet 1,000 mg  1,000 mg Oral Q6H PRN Iva Bing, MD      . acetaminophen (TYLENOL) tablet 650 mg  650 mg Oral Q6H PRN Darien Ramus, MD   650 mg at 07/26/14 0726  . calcium carbonate (TUMS - dosed in mg elemental calcium) chewable tablet 400 mg of elemental calcium  2 tablet Oral Q4H PRN [place] Bing, MD   400 mg of elemental calcium at 07/25/14 1552  .  cefTRIAXone (ROCEPHIN) 1 g in dextrose 5 % 50 mL IVPB - Premix  1 g Intravenous Q24H [place] Bing, MD   1 g at 07/25/14 2143  . docusate sodium (COLACE) capsule 100 mg  100 mg Oral BID PRN [place] Bing, MD   100 mg at 07/26/14 0725  . lactated ringers 1,000 mL with potassium chloride 30 mEq infusion   Intravenous Continuous [place] Bing, MD 150 mL/hr at 07/26/14 0845    . ondansetron (ZOFRAN) injection 4 mg  4 mg Intravenous Q4H PRN Nadara Mustard, MD   4 mg at 07/25/14 2143  . oxyCODONE-acetaminophen (PERCOCET/ROXICET) 5-325 MG per tablet 1-2 tablet  1-2 tablet Oral Q3H PRN Nadara Mustard, MD   1 tablet at 07/24/14 2245  . prenatal multivitamin tablet 1 tablet  1 tablet Oral Q1200 [place] Bing, MD   1 tablet at 07/25/14 2143  . simethicone (MYLICON) chewable tablet 80 mg  80 mg Oral QID PRN Nadara Mustard, MD   80 mg at 07/26/14 0725    Recent Labs Lab 07/23/14 1603 07/25/14 1022  WBC 17.4* 10.3  HGB 10.0* 8.9*  HCT  31.3* 27.6*  PLT 210 181   BCx x 2 with GNR and pansensitive UCx >100K GNR with Sx still pending UDS: +THC GC/CT with +GC  Radiology: 5/27 renal u/s with s/s of pyelo but no abscess  Assessment & Plan:  Patient stable *IUP: fetal status reassuring. qshift FHRs *ID: currently D#4 for rocephin. Follow up CBC, UCx results. Rpt Renal u/s ordered. Can consult ID tomorrow but will do sooner if patient scenario worsens, for +BCx management -s/p azithromycin for GC *FEN/GI: can SLIV. Regular diet. Potassium ordered. Follow up BMP *Anemia: likely somewhat due to dilution. Follow up CBCs and check record to see if R[place] testing done.  *Pain: no current issues *PPx: SCDs, OOB *Dispo: pending afebrile for at least 24hrs and ID recommendation for BCx management  Alicia Costa, Jr. MD [facility] [phone]

## 2014-07-26 NOTE — Progress Notes (Signed)
Notified Megan, Rn of vs

## 2014-07-27 LAB — CULTURE, BLOOD (ROUTINE X 2)

## 2014-07-27 MED ORDER — ONDANSETRON HCL 4 MG PO TABS
4.0000 mg | ORAL_TABLET | Freq: Three times a day (TID) | ORAL | Status: DC | PRN
  Administered 2014-07-27: 4 mg via ORAL
  Filled 2014-07-27: qty 1

## 2014-07-27 MED ORDER — CEPHALEXIN 500 MG PO CAPS
500.0000 mg | ORAL_CAPSULE | Freq: Four times a day (QID) | ORAL | Status: DC
  Administered 2014-07-27 – 2014-07-28 (×4): 500 mg via ORAL
  Filled 2014-07-27 (×4): qty 1

## 2014-07-27 NOTE — Progress Notes (Signed)
Obstetric and Gynecology  HD # 5  Subjective  Patient doing well, no complaints, tolerating PO intake, tolerating pain with PO meds, ambulating without difficulty, voiding spontaneously.     Denies CP, SOB, F/C, N/V/D, or leg pain.   Objective   Filed Vitals:     BP: 116/63  Pulse: 91  Temp: 98.4 (Tmax 100.3)  Resp: 20     General: NAD Cardiovascular: RRR, no murmurs Pulmonary: CTAB, normal respiratory effort Abdomen: Benign. Non-tender, +BS, no guarding.  Back: no CVA tenderness Extremities: No erythema or cords, no calf tenderness, with normal peripheral pulses.  Labs: Results for orders placed or performed during the hospital encounter of 07/23/14 (from the past 24 hour(s))  CBC with Differential/Platelet     Status: Abnormal   Collection Time: 07/26/14 10:40 AM  Result Value Ref Range   WBC 11.2 (H) 3.6 - 11.0 K/uL   RBC 3.59 (L) 3.80 - 5.20 MIL/uL   Hemoglobin 9.5 (L) 12.0 - 16.0 g/dL   HCT 29.5 (L) 28.4 - 13.2 %   MCV 82.2 80.0 - 100.0 fL   MCH 26.5 26.0 - 34.0 pg   MCHC 32.2 32.0 - 36.0 g/dL   RDW 44.0 10.2 - 72.5 %   Platelets 260 150 - 440 K/uL   Neutrophils Relative % 86 %   Neutro Abs 9.6 (H) 1.4 - 6.5 K/uL   Lymphocytes Relative 6 %   Lymphs Abs 0.6 (L) 1.0 - 3.6 K/uL   Monocytes Relative 8 %   Monocytes Absolute 0.9 0.2 - 0.9 K/uL   Eosinophils Relative 0 %   Eosinophils Absolute 0.0 0 - 0.7 K/uL   Basophils Relative 0 %   Basophils Absolute 0.0 0 - 0.1 K/uL  Basic metabolic panel     Status: Abnormal   Collection Time: 07/26/14 10:40 AM  Result Value Ref Range   Sodium 137 135 - 145 mmol/L   Potassium 3.6 3.5 - 5.1 mmol/L   Chloride 108 101 - 111 mmol/L   CO2 20 (L) 22 - 32 mmol/L   Glucose, Bld 138 (H) 65 - 99 mg/dL   BUN <5 (L) 6 - 20 mg/dL   Creatinine, Ser 3.66 0.44 - 1.00 mg/dL   Calcium 8.6 (L) 8.9 - 10.3 mg/dL   GFR calc non Af Amer >60 >60 mL/min   GFR calc Af Amer >60 >60 mL/min   Anion gap 9 5 - 15    Cultures: Results for  orders placed or performed during the hospital encounter of 07/23/14  Urine culture     Status: None   Collection Time: 07/23/14  4:03 PM  Result Value Ref Range Status   Specimen Description URINE, CLEAN CATCH  Final   Special Requests NONE  Final   Culture >=100,000 COLONIES/mL ESCHERICHIA COLI  Final   Report Status 07/26/2014 FINAL  Final   Organism ID, Bacteria ESCHERICHIA COLI  Final      Susceptibility   Escherichia coli - MIC*    AMPICILLIN <=2 SENSITIVE Sensitive     CEFTAZIDIME <=1 SENSITIVE Sensitive     CEFAZOLIN <=4 SENSITIVE Sensitive     CEFTRIAXONE <=1 SENSITIVE Sensitive     CIPROFLOXACIN <=0.25 SENSITIVE Sensitive     GENTAMICIN <=1 SENSITIVE Sensitive     IMIPENEM <=0.25 SENSITIVE Sensitive     TRIMETH/SULFA <=20 SENSITIVE Sensitive     CEFOXITIN <=4 SENSITIVE Sensitive     * >=100,000 COLONIES/mL ESCHERICHIA COLI  Culture, blood (routine x 2)  Status: None (Preliminary result)   Collection Time: 07/23/14  4:03 PM  Result Value Ref Range Status   Specimen Description BLOOD  Final   Special Requests NONE  Final   Culture  Setup Time   Final    GRAM NEGATIVE RODS IN BOTH AEROBIC AND ANAEROBIC BOTTLES CRITICAL RESULT CALLED TO, READ BACK BY AND VERIFIED WITH: GIVEN TO DONALD SWEENEY ON 07/24/14 BY JEF    Culture   Final    ESCHERICHIA COLI IN BOTH AEROBIC AND ANAEROBIC BOTTLES    Report Status PENDING  Incomplete   Organism ID, Bacteria ESCHERICHIA COLI  Final      Susceptibility  Culture, blood (routine x 2)     Status: None   Collection Time: 07/23/14  4:40 PM  Result Value Ref Range Status   Specimen Description BLOOD  Final   Special Requests NONE  Final   Culture  Setup Time   Final    GRAM NEGATIVE RODS IN BOTH AEROBIC AND ANAEROBIC BOTTLES CRITICAL RESULT CALLED TO, READ BACK BY AND VERIFIED WITH: GIVEN TO DONALD SWEENEY ON 07/24/14 AT 0925 BY JEF       Susceptibility  Chlamydia/NGC rt PCR [facility])     Status: None  Result Value Ref Range Status    Specimen source GC/Chlam URINE, RANDOM  Final   Chlamydia Tr NOT DETECTED  Final   N gonorrhoeae DETECTED  Final    Comment: (NOTE) 100  This methodology has not been evaluated in pregnant women or in 200  patients with a history of hysterectomy. 300 400  This methodology will not be performed on patients less than 7314  years of age.     Imaging: Koreas Renal  07/26/2014   CLINICAL DATA:  Right flank pain, pyelonephritis, [redacted] weeks pregnant  EXAM: RENAL / URINARY TRACT ULTRASOUND COMPLETE  COMPARISON:  07/23/2014  FINDINGS: Right Kidney:  Length: 13.2 cm. Increased echogenicity in the right upper kidney. No mass or hydronephrosis visualized.  Left Kidney:  Length: 12.0 cm. Echogenicity within normal limits. No mass or hydronephrosis visualized.  Bladder:  Within normal limits.  IMPRESSION: Increased echogenicity in the right upper kidney, possibly reflecting pyelonephritis, unchanged.  No hydronephrosis.   Electronically Signed   By: Charline BillsSriyesh  Krishnan M.D.   On: 07/26/2014 14:29    Assessment   19 [facility] with acute pyelonephritis in 2nd trimester of pregnancy  Plan   1. Pyelonephritis: E. Coli.  Continue antibiotic treatment, change from IV to PO since afebrile x 24 hours. -Keflex 500mg  QID -will need suppression for the remainder of pregnancy   2. Gonorrhea: s/p treatment with rocephin and azithromycin  -will need test of cure in 1 month, and again prior to delivery  3. IUP: Continue FHR qshift  4. Dispo: anticipate d/c home tomorrow

## 2014-07-28 DIAGNOSIS — N12 Tubulo-interstitial nephritis, not specified as acute or chronic: Secondary | ICD-10-CM | POA: Diagnosis present

## 2014-07-28 DIAGNOSIS — Z3490 Encounter for supervision of normal pregnancy, unspecified, unspecified trimester: Secondary | ICD-10-CM

## 2014-07-28 HISTORY — DX: Tubulo-interstitial nephritis, not specified as acute or chronic: N12

## 2014-07-28 MED ORDER — PRENATAL MULTIVITAMIN CH: 1.0000 | ORAL_TABLET | Freq: Every day | ORAL | Status: DC

## 2014-07-28 MED ORDER — CLOTRIMAZOLE 2 % VA CREA: 1.0000 | TOPICAL_CREAM | Freq: Every day | VAGINAL | Status: DC

## 2014-07-28 MED ORDER — ACETAMINOPHEN 500 MG PO TABS: ORAL_TABLET | ORAL | Status: DC

## 2014-07-28 MED ORDER — CEPHALEXIN 500 MG PO CAPS: 500.0000 mg | ORAL_CAPSULE | Freq: Four times a day (QID) | ORAL | Status: DC

## 2014-07-28 MED ORDER — ONDANSETRON HCL 4 MG PO TABS: 4.0000 mg | ORAL_TABLET | Freq: Three times a day (TID) | ORAL | Status: DC | PRN

## 2014-07-28 NOTE — Progress Notes (Signed)
Patient discharge home escorted out by auxillary in wheelchair.

## 2014-07-28 NOTE — Discharge Instructions (Signed)
F/u sooner with fever, problems breathing, pain not helped by medications, not keeping down fluids, signs of uti such as urine frequency, pain with voiding or blood in urine, or any questions or concerns

## 2014-07-28 NOTE — Progress Notes (Signed)
All discharge instructions given to patient and she voices understanding of all instructions given. appt f/u for 1 wk made and she is aware of date and time. Prescriptions given. Patient is going to take a shower and wait on her ride.

## 2014-07-29 LAB — CULTURE, BLOOD (ROUTINE X 2)

## 2014-09-16 LAB — OB RESULTS CONSOLE GC/CHLAMYDIA
Chlamydia: NEGATIVE
Gonorrhea: NEGATIVE

## 2014-10-18 ENCOUNTER — Inpatient Hospital Stay
Admission: EM | Admit: 2014-10-18 | Discharge: 2014-10-18 | Disposition: A | Discharge status: Home / Self Care | Payer: Medicaid Other | Attending: Obstetrics and Gynecology | Admitting: Obstetrics and Gynecology

## 2014-10-18 DIAGNOSIS — Z3A35 35 weeks gestation of pregnancy: Secondary | ICD-10-CM | POA: Insufficient documentation

## 2014-10-18 DIAGNOSIS — R102 Pelvic and perineal pain: Secondary | ICD-10-CM

## 2014-10-18 DIAGNOSIS — O26893 Other specified pregnancy related conditions, third trimester: Secondary | ICD-10-CM | POA: Insufficient documentation

## 2014-10-18 DIAGNOSIS — R109 Unspecified abdominal pain: Secondary | ICD-10-CM | POA: Insufficient documentation

## 2014-10-18 DIAGNOSIS — O26899 Other specified pregnancy related conditions, unspecified trimester: Secondary | ICD-10-CM

## 2014-10-18 NOTE — OB Triage Note (Signed)
Ms. Rehfeldt here with c/o pelvic/abdominal discomfort since midnight. Did not time occurrence of pain, denies bleeding, LOF. +FM.

## 2014-10-18 NOTE — Progress Notes (Addendum)
Patient ID: Alicia Costa, female   DOB: [birth year], 19 y.o.   MRN: [id number]  CTSP due to pt c/o "feeling a grinding in the lower pelvis that is uncomfortable". PNC at [facility] significant for hx of pyleonephritis and on her record is proteinuria. Pt was to be seen at [facility] but, missed appt. No ROM, decreased FM, Vag Bleeding or any UC's today. Pt denies any back or flank pain today. Pt showed a RX that had been given to her by Dr Leeroy Bock Ward Keflex 250 mg po a hs is the way she is taking them currently. PMH: anemia, BV, Proteinuria, anxiety, depression, chronic migraines, Pyleonephritis with this preg.  No past surgical history on file.  No family history on file.  Allergies: NKA Occup: Works in [employer], grad from [employer] Social: FOB non-involved. Lives with Mom. Smoker of tobacco. No ETOH, rec drugs. Single. OB/GYN: G2P1001, 1 prior NSVD. Meds: PNV's. Review of Systems  Constitutional: Negative.   HENT: Negative.   Eyes: Negative.   Respiratory: Negative.   Cardiovascular: Negative for claudication.  Gastrointestinal: Negative.   Genitourinary: Negative.   Musculoskeletal: Negative.   Skin: Negative.   Neurological: Negative.   Psychiatric/Behavioral: Negative.   Physical Exam: Gen: 19 yo black female in NAD. Heart: S1S2, RRR, no M/R/G. Lungs: CTA bilat. Abd: Gravid Cx: 1/50%/vtx-2 NST reactitve with 2 accels 15 x 15 BPM DTR"s 1+/0 A: IUP at 36 weeks 2. Hx of pyleonephritis P: 1. Urine culture prior to dc 2. FU at [facility] for urine dip and for fu meds 3. Pt reassured that baby looks great with reactive NST  On monitor and no labor pattern 4. DC home

## 2014-10-20 LAB — URINE CULTURE: Culture: NO GROWTH

## 2014-10-28 ENCOUNTER — Other Ambulatory Visit: Payer: Self-pay | Admitting: Family Medicine

## 2014-10-28 DIAGNOSIS — Z3483 Encounter for supervision of other normal pregnancy, third trimester: Secondary | ICD-10-CM

## 2014-10-28 LAB — OB RESULTS CONSOLE GC/CHLAMYDIA
Chlamydia: NEGATIVE
Gonorrhea: NEGATIVE

## 2014-10-28 LAB — OB RESULTS CONSOLE GBS: GBS: NEGATIVE

## 2014-11-02 ENCOUNTER — Ambulatory Visit: Payer: Medicaid Other

## 2014-11-08 ENCOUNTER — Ambulatory Visit
Admission: RE | Admit: 2014-11-08 | Discharge: 2014-11-08 | Disposition: A | Discharge status: Home / Self Care | Payer: Medicaid Other | Source: Ambulatory Visit | Attending: Family Medicine | Admitting: Family Medicine

## 2014-11-08 DIAGNOSIS — Z3A36 36 weeks gestation of pregnancy: Secondary | ICD-10-CM | POA: Insufficient documentation

## 2014-11-08 DIAGNOSIS — Z36 Encounter for antenatal screening of mother: Secondary | ICD-10-CM | POA: Diagnosis not present

## 2014-11-08 DIAGNOSIS — Z3483 Encounter for supervision of other normal pregnancy, third trimester: Secondary | ICD-10-CM

## 2014-11-20 ENCOUNTER — Observation Stay
Admission: EM | Admit: 2014-11-20 | Discharge: 2014-11-20 | Disposition: A | Discharge status: Home / Self Care | Payer: Medicaid Other | Attending: Obstetrics and Gynecology | Admitting: Obstetrics and Gynecology

## 2014-11-20 DIAGNOSIS — O471 False labor at or after 37 completed weeks of gestation: Principal | ICD-10-CM | POA: Diagnosis present

## 2014-11-20 DIAGNOSIS — Z3A37 37 weeks gestation of pregnancy: Secondary | ICD-10-CM | POA: Insufficient documentation

## 2014-11-20 MED ORDER — CALCIUM CARBONATE ANTACID 500 MG PO CHEW: 2.0000 | CHEWABLE_TABLET | Freq: Once | ORAL | Status: DC

## 2014-11-20 NOTE — OB Triage Provider Note (Signed)
History     CSN: [id number]  Arrival date and time: 11/20/14 0114   None     HPI:  Alicia Costa is a 19 year old, African American female, G2P1 at 40+[redacted] weeks pregnant with and EDC of 11/18/14 by [place] at 11+5weeks.  Pt. Receives prenatal care at [facility]. Pt. Presents to triage today for a labor check and r/o ROM.  Pt. Felt heartburn earlier this morning and experienced heavy vaginal discharge around 2am and came to triage thinking her water broke.  Pt. States "her water did not break on her own with her last pregnancy and was unsure what that felt like." Pt. Had an NSVD at 38 weeks on 05/18/2013/ 7#3 female.    OB History    Gravida Para Term Preterm AB TAB SAB Ectopic Multiple Living   0 0 0 0 0        No past medical history on file.  No past surgical history on file.  No family history on file.  Social History  Substance Use Topics  . Smoking status: Never Smoker   . Smokeless tobacco: Not on file  . Alcohol Use: No    Allergies: No Known Allergies  Prescriptions prior to admission  Medication Sig Dispense Refill Last Dose  . acetaminophen (TYLENOL) 500 MG tablet 1-2 tabs every 4-6 hours as needed for pain 30 tablet 0   . cephALEXin (KEFLEX) 500 MG capsule Take 1 capsule (500 mg total) by mouth every 6 (six) hours. 40 capsule 0 10/18/2014  . cephALEXin (KEFLEX) 500 MG capsule Take 1 capsule (500 mg total) by mouth 4 (four) times daily. 31 capsule 4   . clotrimazole (GYNE-LOTRIMIN 3) 2 % vaginal cream Place 1 Applicatorful vaginally at bedtime. 21 g 0   . ondansetron (ZOFRAN) 4 MG tablet Take 1 tablet (4 mg total) by mouth every 8 (eight) hours as needed for nausea or vomiting. 90 tablet 1   . Prenatal Vit-Fe Fumarate-FA (PRENATAL MULTIVITAMIN) TABS tablet Take 1 tablet by mouth daily at 12 noon. 90 tablet 1 10/15/2014    Review of Systems  Constitutional: Positive for fever. Negative for chills, weight loss and malaise/fatigue.  HENT: Negative for congestion.    Eyes: Negative for blurred vision.  Respiratory: Negative for cough, hemoptysis and shortness of breath.   Cardiovascular: Negative for chest pain, palpitations and leg swelling.  Gastrointestinal: Positive for heartburn and nausea. Negative for vomiting, diarrhea and constipation.  Genitourinary: Negative for dysuria, urgency and frequency.  Musculoskeletal: Negative for back pain, joint pain and falls.  Skin: Negative for itching and rash.  Neurological: Negative for dizziness and headaches.  Endo/Heme/Allergies: Does not bruise/bleed easily.  Psychiatric/Behavioral: Negative for depression. The patient is not nervous/anxious.   Denies vaginal bleeding/ abnormal discharge with an odor /+increase in vaginal discharge +good fetal movement  Physical Exam   Temperature 98 F (36.7 C), temperature source Oral, resp. rate 18, height  (1.727 m), weight 73.936 kg (163 lb), last menstrual period 12/27/2013.  Physical Exam  Constitutional: She is oriented to person, place, and time. She appears well-developed and well-nourished.  HENT:  Head: Normocephalic.  Eyes: Pupils are equal, round, and reactive to light.  Neck: Normal range of motion.  Cardiovascular: Normal rate, regular rhythm and normal heart sounds.   Respiratory: Effort normal and breath sounds normal. No respiratory distress. She has no wheezes. She exhibits no tenderness.  GI: Soft. Bowel sounds are normal. She exhibits no distension. There is no  tenderness.  Genitourinary: Vagina normal and uterus normal.  Musculoskeletal: Normal range of motion.  Neurological: She is alert and oriented to person, place, and time.  Skin: Skin is warm and dry.  Psychiatric: She has a normal mood and affect.  Pt. Is a poor historian regarding prenatal care SVE: Dilation: 1 Effacement (%): 70 Cervical Position: Posterior Station: -1 Presentation: Vertex Exam by::  Dorris Carnes Mackiewicz RN)  [facility] Prenatal records reviewed: GBS  negative Abnormal 1 hour GTT: 145 with no follow-up for 3 hour US performed on 11/08/14 in [place]: vtx, AFI WNL, posterior placenta, EFW 3719 grams, normal anatomy, female gender   Fetal Assessment: Baseline: 145bmp/ Moderate variability / +15x15 accels / no decels  Toco: irregular contractions   Nitrazine test: negative for AROM Pt. Refused sterile speculum exam to evaluate for pooling / fern      Procedures  NST Nitrazine test   Assessment and Plan  IUP at 40+[redacted] weeks gestation R/O ROM / Labor check Category 1 Fetal Tracing  Likely early latent labor Discharge to home for rest FKC's daily Reviewed s/s to call or come to hospital Labor precautions reviewed F/U at [facility] with regularly scheduled appointment on 11/23/14  Dr. Dalbert Garnet notified of plan of care and agrees   Karena Addison CNM 11/20/2014, 3:18 AM

## 2014-11-22 ENCOUNTER — Inpatient Hospital Stay
Admission: EM | Admit: 2014-11-22 | Discharge: 2014-11-22 | DRG: 782 | Disposition: A | Discharge status: Home / Self Care | Payer: Medicaid Other | Attending: Obstetrics and Gynecology | Admitting: Obstetrics and Gynecology

## 2014-11-22 DIAGNOSIS — Z3A40 40 weeks gestation of pregnancy: Secondary | ICD-10-CM | POA: Diagnosis present

## 2014-11-22 DIAGNOSIS — O480 Post-term pregnancy: Principal | ICD-10-CM | POA: Diagnosis present

## 2014-11-22 LAB — CBC
HCT: 27.4 % — ABNORMAL LOW (ref 35.0–47.0)
Hemoglobin: 9 g/dL — ABNORMAL LOW (ref 12.0–16.0)
MCH: 25.5 pg — ABNORMAL LOW (ref 26.0–34.0)
MCHC: 32.9 g/dL (ref 32.0–36.0)
MCV: 77.7 fL — ABNORMAL LOW (ref 80.0–100.0)
Platelets: 298 10*3/uL (ref 150–440)
RBC: 3.53 MIL/uL — ABNORMAL LOW (ref 3.80–5.20)
RDW: 14.9 % — ABNORMAL HIGH (ref 11.5–14.5)
WBC: 7.9 10*3/uL (ref 3.6–11.0)

## 2014-11-22 LAB — CHLAMYDIA/NGC RT PCR (ARMC ONLY)
Chlamydia Tr: NOT DETECTED
N gonorrhoeae: NOT DETECTED

## 2014-11-22 LAB — ABO/RH: ABO/RH(D): O POS

## 2014-11-22 LAB — TYPE AND SCREEN
ABO/RH(D): O POS
Antibody Screen: NEGATIVE

## 2014-11-22 MED ORDER — LACTATED RINGERS IV SOLN
INTRAVENOUS | Status: DC
  Administered 2014-11-22: 18:00:00 via INTRAVENOUS

## 2014-11-22 MED ORDER — MISOPROSTOL 200 MCG PO TABS
ORAL_TABLET | ORAL | Status: AC
  Filled 2014-11-22: qty 4

## 2014-11-22 MED ORDER — OXYTOCIN BOLUS FROM INFUSION: 500.0000 mL | INTRAVENOUS | Status: DC

## 2014-11-22 MED ORDER — OXYTOCIN 40 UNITS IN LACTATED RINGERS INFUSION - SIMPLE MED
62.5000 mL/h | INTRAVENOUS | Status: DC
  Filled 2014-11-22: qty 1000

## 2014-11-22 MED ORDER — LIDOCAINE HCL (PF) 1 % IJ SOLN
INTRAMUSCULAR | Status: AC
  Filled 2014-11-22: qty 30

## 2014-11-22 MED ORDER — AMMONIA AROMATIC IN INHA
RESPIRATORY_TRACT | Status: AC
  Filled 2014-11-22: qty 10

## 2014-11-22 MED ORDER — LACTATED RINGERS IV SOLN: 500.0000 mL | INTRAVENOUS | Status: DC | PRN

## 2014-11-22 NOTE — H&P (Signed)
HISTORY AND PHYSICAL  HISTORY OF PRESENT ILLNESS: Ms. Purington is a 19 y.o. G2P1001 at [redacted]w[redacted]d by LMP consistent with 11 5/7 week ultrasound at [facility] presenting for labor signs and symptoms at 40 5/7 weeks. Pt being monitored with UC's spacing out. NO ROM, no VB, UC's becoming more irregular and no c/o "Decreased FM".   She has been having contractions Q 7 mins and denies leakage of fluid, vaginal bleeding, or decreased fetal movement.   Pt was seen on 11/20/14 for labor check and c/o "ROM". ROM ruled out and pt discharged. No fluid seen on visualizing perineum.   REVIEW OF SYSTEMS: A complete review of systems was performed and was specifically negative for headache, changes in vision, RUQ pain, shortness of breath, chest pain, lower extremity edema and dysuria.   HISTORY:  History reviewed. No pertinent past medical history.  History reviewed. No pertinent past surgical history.  No current facility-administered medications on file prior to encounter.   Current Outpatient Prescriptions on File Prior to Encounter  Medication Sig Dispense Refill  . cephALEXin (KEFLEX) 500 MG capsule Take 1 capsule (500 mg total) by mouth every 6 (six) hours. (Patient taking differently: Take 500 mg by mouth once. ) 40 capsule 0  . Prenatal Vit-Fe Fumarate-FA (PRENATAL MULTIVITAMIN) TABS tablet Take 1 tablet by mouth daily at 12 noon. 90 tablet 1  . acetaminophen (TYLENOL) 500 MG tablet 1-2 tabs every 4-6 hours as needed for pain 30 tablet 0  . cephALEXin (KEFLEX) 500 MG capsule Take 1 capsule (500 mg total) by mouth 4 (four) times daily. (Patient not taking: Reported on 11/22/2014) 31 capsule 4  . clotrimazole (GYNE-LOTRIMIN 3) 2 % vaginal cream Place 1 Applicatorful vaginally at bedtime. (Patient not taking: Reported on 11/22/2014) 21 g 0  . ondansetron (ZOFRAN) 4 MG tablet Take 1 tablet (4 mg total) by mouth every 8 (eight) hours as needed for nausea or vomiting. (Patient not taking:  Reported on 11/22/2014) 90 tablet 1     No Known Allergies  OB History  Gravida Para Term Preterm AB SAB TAB Ectopic Multiple Living  # Outcome Date GA Lbr Len/2nd Weight Sex Delivery Anes PTL Lv  2 Current           1 Term               Gynecologic History: History of Abnormal Pap Smear:  History of STI:neg  Social History  Substance Use Topics  . Smoking status: Never Smoker   . Smokeless tobacco: None  . Alcohol Use: No   ROS: Benign x 9  PHYSICAL EXAM: Temp:  [98 F (36.7 C)-98.1 F (36.7 C)] 98.1 F (36.7 C) (09/26 1955) Pulse Rate:  [102-109] 102 (09/26 1955) Resp:  [18] 18 (09/26 1955) BP: (118-130)/(74-84) 130/74 mmHg (09/26 1955) Weight:  [163 lb (73.936 kg)] 163 lb (73.936 kg) (09/26 1817)  GENERAL: NAD AAOx3 CHEST:CTAB no increased work of breathing CV:RRR no appreciable murmurs, rubs, gallops ABDOMEN: gravid, nontender, EFW 6#4 by [place] EXTREMITIES:  Warm and well-perfused, nontender, nonedematous,  CERVIX: 4/80/vtx-2 No fluid seen or found on exam of vagina. Cx exam completed with BOW palpable.   FHTs baseline with marked  Variability,+accelerations and no decelerations. 1 maternal (decel noted)  Toco: q 15 mins  DIAGNOSTIC STUDIES:  Recent Labs Lab 11/22/14 1749  WBC 7.9  HGB 9.0*  HCT 27.4*  PLT 298  PRENATAL STUDIES:  Prenatal Labs:  MBT:  O pos, ; Rubella immune, Varicella immune, HIV neg, RPR neg, Hep B neg, GC/CL neg, GBS neg, glucola   Last [place]  AF wnl, normal anatomy  ASSESSMENT AND PLAN:  1. Fetal Well being  - Fetal Tracing: 150, Cat I - Ultrasound: reviewed, as above - Group B Streptococcus:neg - Presentation: vtx confirmed by CJOnes, CNM  2. Routine OB: - Prenatal labs reviewed, as above - Rh O pos   3. Induction of Labor:  -  Contractionsexternal toco in place -  Plan for induction with AROM  After entering the room and discussing options with pt, it was decided to AROM pt. While  attempting to AROM pt, she almost contaminated the amniohook by putting her legs together. I advised her to open her left leg so I could do the exam. Upon entering the vagina, the cx was very posterior and at a -2 station. I was attempted to place the [facility] when the pt reached to grab my arm and I informed her to stop so that we could proceed. She then climbed up the bed away from the exam. I reached the cx which was 4/80%/vtx-2 and the pt moved pulling my hand nearly out of the vagina. I then stopped the procedure as the pt was yelling out "STOP". I abandoned the procedure due to the pt not tolerating the exam when she started yelling loudly, "What are you doing?". Why did you stop?" She became very hsotile and loud and i held my hand up and said, "Stop". I cannot assault a patient.  I said, "You are not going to yell out at me". Then she began to curse 4 letter words and screaming out "What are you doing?" Why are you stopping?" She proceeded louder and louder till I said, "I am leaving the room. I came out to the desk and called the Supervisor. The pt got louder and louder screaming out and yelling. Security and the police were on site and heard the situation. The Supervisor came in the room and also felt the patient was screaming out and yelling that the "Doctor got an Attitude" and would not continue. The behaviour continued for about an hour total. I informed Dr Feliberto Gottron that the patient was hostile and that I was going to stop the AROM due to her behaviour. Her mom and other visitor also got vocal and loud and the RN, Herbert Seta noted the abnormal behaviour of the patient and also felt unsafe. As the CNM, I did not feel safe as that pt was very threatening and calling names such as "F----- Bit---and "FU" and that a "White Girl was seen before her" and that because she was black that she was being asked to leave. The nurse, Herbert Seta, Supervisor, Warden/ranger and the police officer and Security witnessed this  event. The mom wanted everyone's name so she could sue in case anything happened to her grandbaby. Due to the situation, I advised the Supervisor that she should fu at [facility] when she goes into labor since she was being discharged to wait for labor. The patient continued cursing me till she left escorted by Security.

## 2014-11-23 LAB — RPR: RPR Ser Ql: NONREACTIVE

## 2014-11-23 NOTE — Progress Notes (Signed)
Milon Score CNM and I walked into the pt's room to preform a SVE and to AROM.  While Jones CNM was trying to preform SVE pt was closing her legs and climbing up the bed and kept stating "oh gah stop...ok stop".  As Jones was holding amnihook the pt kept trying to close her legs and moving.  Yetta Barre was informing her to be still and so she could proceed and she still continued to move.  Jones then stated that she was not going to continue with the procedure and then the pt became upset and stared screaming louder and louder "What are you doing? Why are you stopping?".  Jones proceeded to explain why she stopped and the pt was still yelling at her with curse words.  That's when Jones said "you are not going to talk to me that way" and the pt continued to escalate so Jones said "ok I'm leaving, your not going to talk to me that way".  The pt was requesting the supervisor at that point, which I then walked out and called.  As I was exiting the room she and her mother was cursing names at [place] "that F---ing Bi---".  Alisha RN came to the floor and proceeded into the room to try and defuse the situation.  The pt was eventually d/c home escorted by security after about a hour.  By the time they left I was feeling unsafe and uncomfortable from the situation.  The mother of the daughter looked me in the eyes and rocking her head back and forth saying "if anything happens to my grandchilderns or daughter then every single one of you will get what's coming for you!"

## 2015-02-08 ENCOUNTER — Encounter: Payer: Self-pay | Admitting: Medical Oncology

## 2015-02-08 ENCOUNTER — Emergency Department: Payer: No Typology Code available for payment source

## 2015-02-08 ENCOUNTER — Emergency Department
Admission: EM | Admit: 2015-02-08 | Discharge: 2015-02-08 | Disposition: A | Discharge status: Home / Self Care | Payer: No Typology Code available for payment source | Attending: Emergency Medicine | Admitting: Emergency Medicine

## 2015-02-08 DIAGNOSIS — Z3202 Encounter for pregnancy test, result negative: Secondary | ICD-10-CM | POA: Insufficient documentation

## 2015-02-08 DIAGNOSIS — S161XXA Strain of muscle, fascia and tendon at neck level, initial encounter: Secondary | ICD-10-CM | POA: Insufficient documentation

## 2015-02-08 DIAGNOSIS — Y92410 Unspecified street and highway as the place of occurrence of the external cause: Secondary | ICD-10-CM | POA: Insufficient documentation

## 2015-02-08 DIAGNOSIS — Y9389 Activity, other specified: Secondary | ICD-10-CM | POA: Diagnosis not present

## 2015-02-08 DIAGNOSIS — S39012A Strain of muscle, fascia and tendon of lower back, initial encounter: Secondary | ICD-10-CM

## 2015-02-08 DIAGNOSIS — Y998 Other external cause status: Secondary | ICD-10-CM | POA: Insufficient documentation

## 2015-02-08 DIAGNOSIS — S199XXA Unspecified injury of neck, initial encounter: Secondary | ICD-10-CM | POA: Diagnosis present

## 2015-02-08 LAB — POCT PREGNANCY, URINE: Preg Test, Ur: NEGATIVE

## 2015-02-08 MED ORDER — NAPROXEN 500 MG PO TABS: 500.0000 mg | ORAL_TABLET | Freq: Two times a day (BID) | ORAL | Status: DC

## 2015-02-08 NOTE — Discharge Instructions (Signed)
Cervical Strain and Sprain With Rehab  Cervical strain and sprain are injuries that commonly occur with "whiplash" injuries. Whiplash occurs when the neck is forcefully whipped backward or forward, such as during a motor vehicle accident or during contact sports. The muscles, ligaments, tendons, discs, and nerves of the neck are susceptible to injury when this occurs.  RISK FACTORS  Risk of having a whiplash injury increases if:  · Osteoarthritis of the spine.  · Situations that make head or neck accidents or trauma more likely.  · High-risk sports (football, rugby, wrestling, hockey, auto racing, gymnastics, diving, contact karate, or boxing).  · Poor strength and flexibility of the neck.  · Previous neck injury.  · Poor tackling technique.  · Improperly fitted or padded equipment.  SYMPTOMS   · Pain or stiffness in the front or back of neck or both.  · Symptoms may present immediately or up to 24 hours after injury.  · Dizziness, headache, nausea, and vomiting.  · Muscle spasm with soreness and stiffness in the neck.  · Tenderness and swelling at the injury site.  PREVENTION  · Learn and use proper technique (avoid tackling with the head, spearing, and head-butting; use proper falling techniques to avoid landing on the head).  · Warm up and stretch properly before activity.  · Maintain physical fitness:    Strength, flexibility, and endurance.    Cardiovascular fitness.  · Wear properly fitted and padded protective equipment, such as padded soft collars, for participation in contact sports.  PROGNOSIS   Recovery from cervical strain and sprain injuries is dependent on the extent of the injury. These injuries are usually curable in 1 week to 3 months with appropriate treatment.   RELATED COMPLICATIONS   · Temporary numbness and weakness may occur if the nerve roots are damaged, and this may persist until the nerve has completely healed.  · Chronic pain due to frequent recurrence of symptoms.  · Prolonged healing,  especially if activity is resumed too soon (before complete recovery).  TREATMENT   Treatment initially involves the use of ice and medication to help reduce pain and inflammation. It is also important to perform strengthening and stretching exercises and modify activities that worsen symptoms so the injury does not get worse. These exercises may be performed at home or with a therapist. For patients who experience severe symptoms, a soft, padded collar may be recommended to be worn around the neck.   Improving your posture may help reduce symptoms. Posture improvement includes pulling your chin and abdomen in while sitting or standing. If you are sitting, sit in a firm chair with your buttocks against the back of the chair. While sleeping, try replacing your pillow with a small towel rolled to 2 inches in diameter, or use a cervical pillow or soft cervical collar. Poor sleeping positions delay healing.   For patients with nerve root damage, which causes numbness or weakness, the use of a cervical traction apparatus may be recommended. Surgery is rarely necessary for these injuries. However, cervical strain and sprains that are present at birth (congenital) may require surgery.  MEDICATION   · If pain medication is necessary, nonsteroidal anti-inflammatory medications, such as aspirin and ibuprofen, or other minor pain relievers, such as acetaminophen, are often recommended.  · Do not take pain medication for 7 days before surgery.  · Prescription pain relievers may be given if deemed necessary by your caregiver. Use only as directed and only as much as you need.    HEAT AND COLD:   · Cold treatment (icing) relieves pain and reduces inflammation. Cold treatment should be applied for 10 to 15 minutes every 2 to 3 hours for inflammation and pain and immediately after any activity that aggravates your symptoms. Use ice packs or an ice massage.  · Heat treatment may be used prior to performing the stretching and  strengthening activities prescribed by your caregiver, physical therapist, or athletic trainer. Use a heat pack or a warm soak.  SEEK MEDICAL CARE IF:   · Symptoms get worse or do not improve in 2 weeks despite treatment.  · New, unexplained symptoms develop (drugs used in treatment may produce side effects).  EXERCISES  RANGE OF MOTION (ROM) AND STRETCHING EXERCISES - Cervical Strain and Sprain  These exercises may help you when beginning to rehabilitate your injury. In order to successfully resolve your symptoms, you must improve your posture. These exercises are designed to help reduce the forward-head and rounded-shoulder posture which contributes to this condition. Your symptoms may resolve with or without further involvement from your physician, physical therapist or athletic trainer. While completing these exercises, remember:   · Restoring tissue flexibility helps normal motion to return to the joints. This allows healthier, less painful movement and activity.  · An effective stretch should be held for at least 20 seconds, although you may need to begin with shorter hold times for comfort.  · A stretch should never be painful. You should only feel a gentle lengthening or release in the stretched tissue.  STRETCH- Axial Extensors  · Lie on your back on the floor. You may bend your knees for comfort. Place a rolled-up hand towel or dish towel, about 2 inches in diameter, under the part of your head that makes contact with the floor.  · Gently tuck your chin, as if trying to make a "double chin," until you feel a gentle stretch at the base of your head.  · Hold __________ seconds.  Repeat __________ times. Complete this exercise __________ times per day.   STRETCH - Axial Extension   · Stand or sit on a firm surface. Assume a good posture: chest up, shoulders drawn back, abdominal muscles slightly tense, knees unlocked (if standing) and feet hip width apart.  · Slowly retract your chin so your head slides back  and your chin slightly lowers. Continue to look straight ahead.  · You should feel a gentle stretch in the back of your head. Be certain not to feel an aggressive stretch since this can cause headaches later.  · Hold for __________ seconds.  Repeat __________ times. Complete this exercise __________ times per day.  STRETCH - Cervical Side Bend   · Stand or sit on a firm surface. Assume a good posture: chest up, shoulders drawn back, abdominal muscles slightly tense, knees unlocked (if standing) and feet hip width apart.  · Without letting your nose or shoulders move, slowly tip your right / left ear to your shoulder until your feel a gentle stretch in the muscles on the opposite side of your neck.  · Hold __________ seconds.  Repeat __________ times. Complete this exercise __________ times per day.  STRETCH - Cervical Rotators   · Stand or sit on a firm surface. Assume a good posture: chest up, shoulders drawn back, abdominal muscles slightly tense, knees unlocked (if standing) and feet hip width apart.  · Keeping your eyes level with the ground, slowly turn your head until you feel a gentle stretch along   the back and opposite side of your neck.  · Hold __________ seconds.  Repeat __________ times. Complete this exercise __________ times per day.  RANGE OF MOTION - Neck Circles   · Stand or sit on a firm surface. Assume a good posture: chest up, shoulders drawn back, abdominal muscles slightly tense, knees unlocked (if standing) and feet hip width apart.  · Gently roll your head down and around from the back of one shoulder to the back of the other. The motion should never be forced or painful.  · Repeat the motion 10-20 times, or until you feel the neck muscles relax and loosen.  Repeat __________ times. Complete the exercise __________ times per day.  STRENGTHENING EXERCISES - Cervical Strain and Sprain  These exercises may help you when beginning to rehabilitate your injury. They may resolve your symptoms with or  without further involvement from your physician, physical therapist, or athletic trainer. While completing these exercises, remember:   · Muscles can gain both the endurance and the strength needed for everyday activities through controlled exercises.  · Complete these exercises as instructed by your physician, physical therapist, or athletic trainer. Progress the resistance and repetitions only as guided.  · You may experience muscle soreness or fatigue, but the pain or discomfort you are trying to eliminate should never worsen during these exercises. If this pain does worsen, stop and make certain you are following the directions exactly. If the pain is still present after adjustments, discontinue the exercise until you can discuss the trouble with your clinician.  STRENGTH - Cervical Flexors, Isometric  · Face a wall, standing about 6 inches away. Place a small pillow, a ball about 6-8 inches in diameter, or a folded towel between your forehead and the wall.  · Slightly tuck your chin and gently push your forehead into the soft object. Push only with mild to moderate intensity, building up tension gradually. Keep your jaw and forehead relaxed.  · Hold 10 to 20 seconds. Keep your breathing relaxed.  · Release the tension slowly. Relax your neck muscles completely before you start the next repetition.  Repeat __________ times. Complete this exercise __________ times per day.  STRENGTH- Cervical Lateral Flexors, Isometric   · Stand about 6 inches away from a wall. Place a small pillow, a ball about 6-8 inches in diameter, or a folded towel between the side of your head and the wall.  · Slightly tuck your chin and gently tilt your head into the soft object. Push only with mild to moderate intensity, building up tension gradually. Keep your jaw and forehead relaxed.  · Hold 10 to 20 seconds. Keep your breathing relaxed.  · Release the tension slowly. Relax your neck muscles completely before you start the next  repetition.  Repeat __________ times. Complete this exercise __________ times per day.  STRENGTH - Cervical Extensors, Isometric   · Stand about 6 inches away from a wall. Place a small pillow, a ball about 6-8 inches in diameter, or a folded towel between the back of your head and the wall.  · Slightly tuck your chin and gently tilt your head back into the soft object. Push only with mild to moderate intensity, building up tension gradually. Keep your jaw and forehead relaxed.  · Hold 10 to 20 seconds. Keep your breathing relaxed.  · Release the tension slowly. Relax your neck muscles completely before you start the next repetition.  Repeat __________ times. Complete this exercise __________ times per day.    POSTURE AND BODY MECHANICS CONSIDERATIONS - Cervical Strain and Sprain  Keeping correct posture when sitting, standing or completing your activities will reduce the stress put on different body tissues, allowing injured tissues a chance to heal and limiting painful experiences. The following are general guidelines for improved posture. Your physician or physical therapist will provide you with any instructions specific to your needs. While reading these guidelines, remember:  · The exercises prescribed by your provider will help you have the flexibility and strength to maintain correct postures.  · The correct posture provides the optimal environment for your joints to work. All of your joints have less wear and tear when properly supported by a spine with good posture. This means you will experience a healthier, less painful body.  · Correct posture must be practiced with all of your activities, especially prolonged sitting and standing. Correct posture is as important when doing repetitive low-stress activities (typing) as it is when doing a single heavy-load activity (lifting).  PROLONGED STANDING WHILE SLIGHTLY LEANING FORWARD  When completing a task that requires you to lean forward while standing in one  place for a long time, place either foot up on a stationary 2- to 4-inch high object to help maintain the best posture. When both feet are on the ground, the low back tends to lose its slight inward curve. If this curve flattens (or becomes too large), then the back and your other joints will experience too much stress, fatigue more quickly, and can cause pain.   RESTING POSITIONS  Consider which positions are most painful for you when choosing a resting position. If you have pain with flexion-based activities (sitting, bending, stooping, squatting), choose a position that allows you to rest in a less flexed posture. You would want to avoid curling into a fetal position on your side. If your pain worsens with extension-based activities (prolonged standing, working overhead), avoid resting in an extended position such as sleeping on your stomach. Most people will find more comfort when they rest with their spine in a more neutral position, neither too rounded nor too arched. Lying on a non-sagging bed on your side with a pillow between your knees, or on your back with a pillow under your knees will often provide some relief. Keep in mind, being in any one position for a prolonged period of time, no matter how correct your posture, can still lead to stiffness.  WALKING  Walk with an upright posture. Your ears, shoulders, and hips should all line up.  OFFICE WORK  When working at a desk, create an environment that supports good, upright posture. Without extra support, muscles fatigue and lead to excessive strain on joints and other tissues.  CHAIR:  · A chair should be able to slide under your desk when your back makes contact with the back of the chair. This allows you to work closely.  · The chair's height should allow your eyes to be level with the upper part of your monitor and your hands to be slightly lower than your elbows.  · Body position:    Your feet should make contact with the floor. If this is not  possible, use a foot rest.    Keep your ears over your shoulders. This will reduce stress on your neck and low back.     This information is not intended to replace advice given to you by your health care provider. Make sure you discuss any questions you have with your health care provider.       Document Released: 02/12/2005 Document Revised: 03/05/2014 Document Reviewed: 05/27/2008  [employer]vier Interactive Patient Education ©2016 [employer]
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
## 2015-03-28 ENCOUNTER — Emergency Department
Admission: EM | Admit: 2015-03-28 | Discharge: 2015-03-30 | Disposition: A | Discharge status: Home / Self Care | Payer: Medicaid Other | Attending: Emergency Medicine | Admitting: Emergency Medicine

## 2015-03-28 ENCOUNTER — Encounter: Payer: Self-pay | Admitting: Emergency Medicine

## 2015-03-28 DIAGNOSIS — R109 Unspecified abdominal pain: Secondary | ICD-10-CM | POA: Insufficient documentation

## 2015-03-28 DIAGNOSIS — R197 Diarrhea, unspecified: Secondary | ICD-10-CM | POA: Diagnosis not present

## 2015-03-28 DIAGNOSIS — Z3202 Encounter for pregnancy test, result negative: Secondary | ICD-10-CM | POA: Diagnosis not present

## 2015-03-28 DIAGNOSIS — R1110 Vomiting, unspecified: Secondary | ICD-10-CM | POA: Diagnosis not present

## 2015-03-28 LAB — CBC
HCT: 36 % (ref 35.0–47.0)
Hemoglobin: 11.1 g/dL — ABNORMAL LOW (ref 12.0–16.0)
MCH: 21.7 pg — ABNORMAL LOW (ref 26.0–34.0)
MCHC: 30.9 g/dL — ABNORMAL LOW (ref 32.0–36.0)
MCV: 70.4 fL — ABNORMAL LOW (ref 80.0–100.0)
Platelets: 379 10*3/uL (ref 150–440)
RBC: 5.11 MIL/uL (ref 3.80–5.20)
RDW: 19.8 % — ABNORMAL HIGH (ref 11.5–14.5)
WBC: 10.4 10*3/uL (ref 3.6–11.0)

## 2015-03-28 LAB — COMPREHENSIVE METABOLIC PANEL
ALT: 25 U/L (ref 14–54)
AST: 25 U/L (ref 15–41)
Albumin: 4.9 g/dL (ref 3.5–5.0)
Alkaline Phosphatase: 103 U/L (ref 38–126)
Anion gap: 9 (ref 5–15)
BUN: 15 mg/dL (ref 6–20)
CO2: 21 mmol/L — ABNORMAL LOW (ref 22–32)
Calcium: 9.6 mg/dL (ref 8.9–10.3)
Chloride: 107 mmol/L (ref 101–111)
Creatinine, Ser: 0.86 mg/dL (ref 0.44–1.00)
GFR calc Af Amer: >60 mL/min (ref >60)
GFR calc non Af Amer: >60 mL/min (ref >60)
Glucose, Bld: 100 mg/dL — ABNORMAL HIGH (ref 65–99)
Potassium: 4 mmol/L (ref 3.5–5.1)
Sodium: 137 mmol/L (ref 135–145)
Total Bilirubin: 0.9 mg/dL (ref 0.3–1.2)
Total Protein: 9.4 g/dL — ABNORMAL HIGH (ref 6.5–8.1)

## 2015-03-28 LAB — URINALYSIS COMPLETE WITH MICROSCOPIC (ARMC ONLY)
Bilirubin Urine: NEGATIVE
Glucose, UA: NEGATIVE mg/dL
Hgb urine dipstick: NEGATIVE
Nitrite: NEGATIVE
Protein, ur: 100 mg/dL — AB
Specific Gravity, Urine: 1.033 — ABNORMAL HIGH (ref 1.005–1.030)
pH: 5 (ref 5.0–8.0)

## 2015-03-28 LAB — POCT PREGNANCY, URINE: Preg Test, Ur: NEGATIVE

## 2015-03-28 LAB — LIPASE, BLOOD: Lipase: 22 U/L (ref 11–51)

## 2015-03-28 NOTE — ED Notes (Signed)
Pt c/o mid abdominal pain with vomiting and diarrhea for 3 days.   Reports cannot keep anything down. No blood in vomit or stool. Skin warm and dry in triage. NAD.

## 2015-09-29 ENCOUNTER — Encounter: Payer: Self-pay | Admitting: Emergency Medicine

## 2015-09-29 ENCOUNTER — Emergency Department
Admission: EM | Admit: 2015-09-29 | Discharge: 2015-09-29 | Disposition: A | Discharge status: Home / Self Care | Payer: Medicaid Other | Attending: Student in an Organized Health Care Education/Training Program | Admitting: Student in an Organized Health Care Education/Training Program

## 2015-09-29 DIAGNOSIS — R109 Unspecified abdominal pain: Secondary | ICD-10-CM | POA: Insufficient documentation

## 2015-09-29 DIAGNOSIS — R350 Frequency of micturition: Secondary | ICD-10-CM | POA: Insufficient documentation

## 2015-09-29 DIAGNOSIS — F17210 Nicotine dependence, cigarettes, uncomplicated: Secondary | ICD-10-CM | POA: Diagnosis not present

## 2015-09-29 LAB — URINALYSIS COMPLETE WITH MICROSCOPIC (ARMC ONLY)
Bilirubin Urine: NEGATIVE
Glucose, UA: NEGATIVE mg/dL
Hgb urine dipstick: NEGATIVE
Ketones, ur: NEGATIVE mg/dL
Leukocytes, UA: NEGATIVE
Nitrite: NEGATIVE
Protein, ur: 30 mg/dL — AB
Specific Gravity, Urine: 1.029 (ref 1.005–1.030)
pH: 5 (ref 5.0–8.0)

## 2015-09-29 MED ORDER — NAPROXEN 500 MG PO TABS: 500.0000 mg | ORAL_TABLET | Freq: Two times a day (BID) | ORAL | Status: DC

## 2015-09-29 NOTE — ED Provider Notes (Signed)
[facility] Emergency Department Provider Note   ____________________________________________   First MD Initiated Contact with Patient 09/29/15 1435     (approximate)  I have reviewed the triage vital signs and the nursing notes.   HISTORY  Chief Complaint Urinary Frequency and Flank Pain    HPI Alicia Costa is a 20 y.o. female patient complain a urinary frequency and urgency for 4 days.  Patient also state left flank pain which she believes might be coming from prolonged standing at work. Patient takes she missed her appointment with  PCP today for this complaint. Patient denies any fever or vaginal discharge. Patient rates the pain as a 3/10. No palliative measures taken for this complaint.  History reviewed. No pertinent past medical history.  Patient Active Problem List   Diagnosis Date Noted  . Indication for care or intervention related to labor and delivery 11/22/2014  . False labor after 37 completed weeks of gestation 11/20/2014  . Pelvic pain affecting pregnancy 10/18/2014  . Indication for care in labor and delivery, antepartum 10/18/2014  . Pregnancy 07/28/2014  . Pyelonephritis 07/28/2014  . Pyelonephritis affecting pregnancy in second trimester, antepartum 07/23/2014    History reviewed. No pertinent surgical history.  Prior to Admission medications   Medication Sig Start Date End Date Taking? Authorizing Provider  naproxen (NAPROSYN) 500 MG tablet Take 1 tablet (500 mg total) by mouth 2 (two) times daily with a meal. 02/08/15   Christella Scheuermann, PA-C  naproxen (NAPROSYN) 500 MG tablet Take 1 tablet (500 mg total) by mouth 2 (two) times daily with a meal. 09/29/15   Joni Reining, PA-C    Allergies Review of patient's allergies indicates no known allergies.  Family History  Problem Relation Age of Onset  . Cancer Maternal Grandmother   . Cancer Maternal Grandfather   . Hypertension Paternal Grandmother   . Diabetes  Paternal Grandmother     Social History Social History  Substance Use Topics  . Smoking status: Current Every Day Smoker    Packs/day: 0.25    Types: Cigarettes  . Smokeless tobacco: Never Used  . Alcohol use Yes     Comment: sometimes    Review of Systems Constitutional: No fever/chills Eyes: No visual changes. ENT: No sore throat. Cardiovascular: Denies chest pain. Respiratory: Denies shortness of breath. Gastrointestinal: No abdominal pain.  No nausea, no vomiting.  No diarrhea.  No constipation. Genitourinary: Negative for dysuria.Positive for urgency and frequency. Musculoskeletal:  Left flank pain Skin: Negative for rash. Neurological: Negative for headaches, focal weakness or numbness.    ____________________________________________   PHYSICAL EXAM:  VITAL SIGNS: ED Triage Vitals  Enc Vitals Group     BP 09/29/15 1342 117/79     Pulse Rate 09/29/15 1342 86     Resp 09/29/15 1342 16     Temp 09/29/15 1342 98.3 F (36.8 C)     Temp Source 09/29/15 1342 Oral     SpO2 09/29/15 1342 100 %     Weight 09/29/15 1342 148 lb (67.1 kg)     Height 09/29/15 1342 5\' 8"  (1.727 m)     Head Circumference --      Peak Flow --      Pain Score 09/29/15 1349 5     Pain Loc --      Pain Edu? --      Excl. in GC? --     Constitutional: Alert and oriented. Well appearing and in no acute distress. Eyes:  Conjunctivae are normal. PERRL. EOMI. Head: Atraumatic. Nose: No congestion/rhinnorhea. Mouth/Throat: Mucous membranes are moist.  Oropharynx non-erythematous. Neck: No stridor.  No cervical spine tenderness to palpation. Hematological/Lymphatic/Immunilogical: No cervical lymphadenopathy. Cardiovascular: Normal rate, regular rhythm. Grossly normal heart sounds.  Good peripheral circulation. Respiratory: Normal respiratory effort.  No retractions. Lungs CTAB. Gastrointestinal: Soft and nontender. No distention. No abdominal bruits. Left CVA tenderness. Musculoskeletal: No  lower extremity tenderness nor edema.  No joint effusions. Neurologic:  Normal speech and language. No gross focal neurologic deficits are appreciated. No gait instability. Skin:  Skin is warm, dry and intact. No rash noted. Psychiatric: Mood and affect are normal. Speech and behavior are normal.  ____________________________________________   LABS (all labs ordered are listed, but only abnormal results are displayed)  Labs Reviewed  URINALYSIS COMPLETEWITH MICROSCOPIC ([facility] ONLY) - Abnormal; Notable for the following:       Result Value   Color, Urine YELLOW (*)    APPearance HAZY (*)    Protein, ur 30 (*)    Bacteria, UA RARE (*)    Squamous Epithelial / LPF 6-30 (*)    All other components within normal limits   ____________________________________________  EKG   ____________________________________________  RADIOLOGY   ____________________________________________   PROCEDURES  Procedure(s) performed: None  Procedures  Critical Care performed: No  ____________________________________________   INITIAL IMPRESSION / ASSESSMENT AND PLAN / ED COURSE  Pertinent labs & imaging results that were available during my care of the patient were reviewed by me and considered in my medical decision making (see chart for details).  Urinary frequency and left flank pain. Discussed negative urinalysis with patient. Patient given discharge care instructions. Patient given a work note for today. Patient given a prescription for naproxen. Patient advised to follow-up with her PCP if condition persists or worsens.  Clinical Course     ____________________________________________   FINAL CLINICAL IMPRESSION(S) / ED DIAGNOSES  Final diagnoses:  Left flank pain  Urinary frequency      NEW MEDICATIONS STARTED DURING THIS VISIT:  New Prescriptions   NAPROXEN (NAPROSYN) 500 MG TABLET    Take 1 tablet (500 mg total) by mouth 2 (two) times daily with a meal.     Note:   This document was prepared using Dragon voice recognition software and may include unintentional dictation errors.    Joni Reining, PA-C 09/29/15 1456    Willy Eddy, MD 09/29/15 [phone]

## 2015-09-29 NOTE — ED Triage Notes (Signed)
Patient presents to the ED with urinary frequency and urgency x 4 days and left flank pain.  Patient states, "I kind of feel like I have a UTI."  Patient states she had a doctor's appointment today but had to miss it to help her sister but needs a note to be excused from work today.

## 2015-11-11 ENCOUNTER — Emergency Department (HOSPITAL_COMMUNITY): Payer: Medicaid Other

## 2015-11-11 ENCOUNTER — Emergency Department
Admission: EM | Admit: 2015-11-11 | Discharge: 2015-11-11 | Disposition: A | Discharge status: Home / Self Care | Payer: Medicaid Other | Attending: Emergency Medicine | Admitting: Emergency Medicine

## 2015-11-11 ENCOUNTER — Emergency Department: Payer: Medicaid Other

## 2015-11-11 ENCOUNTER — Encounter: Payer: Self-pay | Admitting: Emergency Medicine

## 2015-11-11 DIAGNOSIS — F17210 Nicotine dependence, cigarettes, uncomplicated: Secondary | ICD-10-CM | POA: Diagnosis not present

## 2015-11-11 DIAGNOSIS — O2341 Unspecified infection of urinary tract in pregnancy, first trimester: Secondary | ICD-10-CM | POA: Diagnosis not present

## 2015-11-11 DIAGNOSIS — O209 Hemorrhage in early pregnancy, unspecified: Secondary | ICD-10-CM | POA: Diagnosis present

## 2015-11-11 DIAGNOSIS — O99331 Smoking (tobacco) complicating pregnancy, first trimester: Secondary | ICD-10-CM | POA: Diagnosis not present

## 2015-11-11 DIAGNOSIS — Z3490 Encounter for supervision of normal pregnancy, unspecified, unspecified trimester: Secondary | ICD-10-CM

## 2015-11-11 DIAGNOSIS — O200 Threatened abortion: Secondary | ICD-10-CM | POA: Diagnosis not present

## 2015-11-11 DIAGNOSIS — Z3A12 12 weeks gestation of pregnancy: Secondary | ICD-10-CM | POA: Insufficient documentation

## 2015-11-11 DIAGNOSIS — N939 Abnormal uterine and vaginal bleeding, unspecified: Secondary | ICD-10-CM

## 2015-11-11 DIAGNOSIS — N390 Urinary tract infection, site not specified: Secondary | ICD-10-CM

## 2015-11-11 LAB — CBC
HCT: 29.6 % — ABNORMAL LOW (ref 35.0–47.0)
Hemoglobin: 9.8 g/dL — ABNORMAL LOW (ref 12.0–16.0)
MCH: 26.4 pg (ref 26.0–34.0)
MCHC: 33.2 g/dL (ref 32.0–36.0)
MCV: 79.3 fL — ABNORMAL LOW (ref 80.0–100.0)
Platelets: 222 10*3/uL (ref 150–440)
RBC: 3.73 MIL/uL — ABNORMAL LOW (ref 3.80–5.20)
RDW: 17.3 % — ABNORMAL HIGH (ref 11.5–14.5)
WBC: 7.7 10*3/uL (ref 3.6–11.0)

## 2015-11-11 LAB — BASIC METABOLIC PANEL
Anion gap: 6 (ref 5–15)
BUN: 12 mg/dL (ref 6–20)
CO2: 22 mmol/L (ref 22–32)
Calcium: 8.9 mg/dL (ref 8.9–10.3)
Chloride: 107 mmol/L (ref 101–111)
Creatinine, Ser: 0.6 mg/dL (ref 0.44–1.00)
GFR calc Af Amer: >60 mL/min (ref >60)
GFR calc non Af Amer: >60 mL/min (ref >60)
Glucose, Bld: 89 mg/dL (ref 65–99)
Potassium: 3.5 mmol/L (ref 3.5–5.1)
Sodium: 135 mmol/L (ref 135–145)

## 2015-11-11 LAB — URINALYSIS COMPLETE WITH MICROSCOPIC (ARMC ONLY)
Bilirubin Urine: NEGATIVE
Glucose, UA: NEGATIVE mg/dL
Nitrite: POSITIVE — AB
Protein, ur: NEGATIVE mg/dL
Specific Gravity, Urine: 1.023 (ref 1.005–1.030)
pH: 6 (ref 5.0–8.0)

## 2015-11-11 LAB — ABO/RH: ABO/RH(D): O POS

## 2015-11-11 LAB — HCG, QUANTITATIVE, PREGNANCY: hCG, Beta Chain, Quant, S: 51292 m[IU]/mL — ABNORMAL HIGH (ref <5)

## 2015-11-11 MED ORDER — CEPHALEXIN 500 MG PO CAPS: 500.0000 mg | ORAL_CAPSULE | Freq: Three times a day (TID) | ORAL | 0 refills | Status: DC

## 2015-11-11 MED ORDER — CEPHALEXIN 500 MG PO CAPS
500.0000 mg | ORAL_CAPSULE | Freq: Once | ORAL | Status: AC
  Administered 2015-11-11: 500 mg via ORAL
  Filled 2015-11-11: qty 1

## 2015-11-11 NOTE — ED Notes (Signed)
The patient was very tearful, and sobbing uncontrollably. When she had been consoled, she agreed that she was ready to have her labs drawn. The patient began shrieking as soon as the vacutainer holder was attached to the butterfly needle. She stated that she is afraid of needles. After being reassured, the patient yelled, "Just do it!" and continued crying, but stood up and jerked her arm away as the needle was being inserted into her arm. This resulted in less than ideal needle placement, so blood was flowing slowly into vacutainers, and the patient was screaming and crying loudly. RN Butch came in to offer assistance, and decided to attempt a second site since all lab tubes were not filled. As Butch was inserting the butterfly needle, the patient again shrieked and jerked her arm away, again resulting in less than ideal needle placement. She continued to scream and cry. Lavendar tubes were not filled. Patient was also unable to produce a urine specimen at this time. A warm blanket was provided.

## 2015-11-11 NOTE — ED Notes (Signed)
Reviewed d/c instructions, follow-up care, and prescription with pt. Pt verbalized understanding 

## 2015-11-11 NOTE — ED Notes (Signed)
Pt reports she is [redacted] weeks pregnant. Pt reports gush of blood last night with intermittent spotting and abdominal pain since.

## 2015-11-11 NOTE — Discharge Instructions (Signed)
1. Take antibiotic as prescribed (Keflex 500 mg 3 times daily 7 days). 2. Do not use tampons, douche or have sexual intercourse until seen by your OB doctor. 3. Return to the ER for worsening symptoms, soaking more than 1 pad per hour, fainting or other concerns.

## 2015-11-11 NOTE — ED Triage Notes (Signed)
Pt to triage via w/c, tearful; st since last night having vag bleeding and abd cramping; G1, approx [redacted]wks pregnant

## 2015-11-11 NOTE — ED Provider Notes (Signed)
[facility] Emergency Department Provider Note   ____________________________________________   First MD Initiated Contact with Patient 11/11/15 [phone]     (approximate)  I have reviewed the triage vital signs and the nursing notes.   HISTORY  Chief Complaint Vaginal Bleeding    HPI Alicia Costa is a 20 y.o. female who presents to the ED from home with a chief complaint of vaginal bleeding and pelvic cramping. Patient is a G3 P2 approximately [redacted] weeks pregnant who noted vaginal spotting since last night. No concerns for STDs. Denies vaginal discharge. Denies fever, chills, chest pain, shortness of breath, nausea, vomiting, diarrhea. Nothing makes her symptoms better or worse.   Past medical history None  Patient Active Problem List   Diagnosis Date Noted  . Indication for care or intervention related to labor and delivery 11/22/2014  . False labor after 37 completed weeks of gestation 11/20/2014  . Pelvic pain affecting pregnancy 10/18/2014  . Indication for care in labor and delivery, antepartum 10/18/2014  . Pregnancy 07/28/2014  . Pyelonephritis 07/28/2014  . Pyelonephritis affecting pregnancy in second trimester, antepartum 07/23/2014    History reviewed. No pertinent surgical history.  Prior to Admission medications   Medication Sig Start Date End Date Taking? Authorizing Provider  cephALEXin (KEFLEX) 500 MG capsule Take 1 capsule (500 mg total) by mouth 3 (three) times daily. 11/11/15   Irean Hong, MD  naproxen (NAPROSYN) 500 MG tablet Take 1 tablet (500 mg total) by mouth 2 (two) times daily with a meal. 02/08/15   Christella Scheuermann, PA-C  naproxen (NAPROSYN) 500 MG tablet Take 1 tablet (500 mg total) by mouth 2 (two) times daily with a meal. 09/29/15   Joni Reining, PA-C    Allergies Review of patient's allergies indicates no known allergies.  Family History  Problem Relation Age of Onset  . Cancer Maternal Grandmother   .  Cancer Maternal Grandfather   . Hypertension Paternal Grandmother   . Diabetes Paternal Grandmother     Social History Social History  Substance Use Topics  . Smoking status: Current Every Day Smoker    Packs/day: 0.25    Types: Cigarettes  . Smokeless tobacco: Never Used  . Alcohol use Yes     Comment: sometimes    Review of Systems  Constitutional: No fever/chills. Eyes: No visual changes. ENT: No sore throat. Cardiovascular: Denies chest pain. Respiratory: Denies shortness of breath. Gastrointestinal: Positive for pelvic cramps. No abdominal pain.  No nausea, no vomiting.  No diarrhea.  No constipation. Genitourinary: Positive for vaginal spotting. Negative for dysuria. Musculoskeletal: Negative for back pain. Skin: Negative for rash. Neurological: Negative for headaches, focal weakness or numbness.  10-point ROS otherwise negative.  ____________________________________________   PHYSICAL EXAM:  VITAL SIGNS: ED Triage Vitals  Enc Vitals Group     BP 11/11/15 0118 117/71     Pulse Rate 11/11/15 0118 97     Resp 11/11/15 0118 18     Temp 11/11/15 0118 97.9 F (36.6 C)     Temp Source 11/11/15 0118 Oral     SpO2 11/11/15 0118 100 %     Weight 11/11/15 0117 150 lb (68 kg)     Height 11/11/15 0117 5\' 9"  (1.753 m)     Head Circumference --      Peak Flow --      Pain Score 11/11/15 0117 9     Pain Loc --      Pain Edu? --  Excl. in GC? --     Constitutional: Alert and oriented. Well appearing and in no acute distress. Eyes: Conjunctivae are normal. PERRL. EOMI. Head: Atraumatic. Nose: No congestion/rhinnorhea. Mouth/Throat: Mucous membranes are moist.  Oropharynx non-erythematous. Neck: No stridor.   Cardiovascular: Normal rate, regular rhythm. Grossly normal heart sounds.  Good peripheral circulation. Respiratory: Normal respiratory effort.  No retractions. Lungs CTAB. Gastrointestinal: Soft and nontender to light or deep palpation. No distention. No  abdominal bruits. No CVA tenderness. Musculoskeletal: No lower extremity tenderness nor edema.  No joint effusions. Neurologic:  Normal speech and language. No gross focal neurologic deficits are appreciated. No gait instability. Skin:  Skin is warm, dry and intact. No rash noted. Psychiatric: Mood and affect are normal. Speech and behavior are normal.  ____________________________________________   LABS (all labs ordered are listed, but only abnormal results are displayed)  Labs Reviewed  HCG, QUANTITATIVE, PREGNANCY - Abnormal; Notable for the following:       Result Value   hCG, Beta Chain, Quant, S 51,292 (*)    All other components within normal limits  URINALYSIS COMPLETEWITH MICROSCOPIC ([facility] ONLY) - Abnormal; Notable for the following:    Color, Urine YELLOW (*)    APPearance CLEAR (*)    Ketones, ur 1+ (*)    Hgb urine dipstick 2+ (*)    Nitrite POSITIVE (*)    Leukocytes, UA TRACE (*)    Bacteria, UA FEW (*)    Squamous Epithelial / LPF 6-30 (*)    All other components within normal limits  CBC - Abnormal; Notable for the following:    RBC 3.73 (*)    Hemoglobin 9.8 (*)    HCT 29.6 (*)    MCV 79.3 (*)    RDW 17.3 (*)    All other components within normal limits  BASIC METABOLIC PANEL  ABO/RH  ABO/RH   ____________________________________________  EKG  None ____________________________________________  RADIOLOGY  Pelvic ultrasound interpreted per Dr. Manus Gunning: Single live intrauterine pregnancy estimated gestational age [redacted]  weeks 1 day for estimated date of delivery 05/24/2016. No evident  complication.   ____________________________________________   PROCEDURES  Procedure(s) performed:   Pelvic exam: External exam WNL without rashes, lesions or vesicles. Speculum exam reveals scant old blood. No active bleeding or clots. Cervical os closed. Bimanual exam WNL.  Procedures  Critical Care performed:  No  ____________________________________________   INITIAL IMPRESSION / ASSESSMENT AND PLAN / ED COURSE  Pertinent labs & imaging results that were available during my care of the patient were reviewed by me and considered in my medical decision making (see chart for details).  20 year old female G3 P2 approximately [redacted] weeks pregnant who presents with vaginal spotting and pelvic cramps. Laboratory, urinalysis and ultrasound results remarkable for mild anemia, UTI and IUP. Will place on Keflex, pelvic rest precautions and patient will follow-up with OB/GYN. At this time we are just waiting on patient's blood type and Rh.  Clinical Course  Comment By Time  Updated patient and mother of a positive blood type. Strict return precautions given. Both verbalize understanding and agree with plan of care. Irean Hong, MD 09/15 [phone]     ____________________________________________   FINAL CLINICAL IMPRESSION(S) / ED DIAGNOSES  Final diagnoses:  Threatened miscarriage  UTI (lower urinary tract infection)  Pregnancy      NEW MEDICATIONS STARTED DURING THIS VISIT:  New Prescriptions   CEPHALEXIN (KEFLEX) 500 MG CAPSULE    Take 1 capsule (500 mg total) by mouth 3 (three)  times daily.     Note:  This document was prepared using Dragon voice recognition software and may include unintentional dictation errors.    Irean HongJade J Tanika Bracco, MD 11/11/15 [phone]

## 2016-01-18 ENCOUNTER — Other Ambulatory Visit: Payer: Self-pay | Admitting: Physician Assistant

## 2016-01-18 DIAGNOSIS — Z3201 Encounter for pregnancy test, result positive: Secondary | ICD-10-CM

## 2016-01-27 ENCOUNTER — Encounter (HOSPITAL_COMMUNITY): Payer: Self-pay

## 2016-01-27 ENCOUNTER — Ambulatory Visit
Admission: RE | Admit: 2016-01-27 | Discharge: 2016-01-27 | Disposition: A | Discharge status: Home / Self Care | Payer: Medicaid Other | Source: Ambulatory Visit | Attending: Physician Assistant | Admitting: Physician Assistant

## 2016-01-27 DIAGNOSIS — Z3A23 23 weeks gestation of pregnancy: Secondary | ICD-10-CM | POA: Diagnosis not present

## 2016-01-27 DIAGNOSIS — Z3201 Encounter for pregnancy test, result positive: Secondary | ICD-10-CM | POA: Insufficient documentation

## 2016-06-24 ENCOUNTER — Encounter
Admission: EM | Disposition: A | Discharge status: Home / Self Care | Payer: Self-pay | Source: Home / Self Care | Attending: Emergency Medicine

## 2016-06-24 ENCOUNTER — Emergency Department: Payer: Medicaid Other | Admitting: Anesthesiology

## 2016-06-24 ENCOUNTER — Encounter: Payer: Self-pay | Admitting: Intensive Care

## 2016-06-24 ENCOUNTER — Emergency Department
Admission: EM | Admit: 2016-06-24 | Discharge: 2016-06-24 | Disposition: A | Discharge status: Home / Self Care | Payer: Medicaid Other | Attending: Emergency Medicine | Admitting: Emergency Medicine

## 2016-06-24 DIAGNOSIS — Z79899 Other long term (current) drug therapy: Secondary | ICD-10-CM | POA: Diagnosis not present

## 2016-06-24 DIAGNOSIS — K649 Unspecified hemorrhoids: Secondary | ICD-10-CM | POA: Diagnosis present

## 2016-06-24 DIAGNOSIS — L0501 Pilonidal cyst with abscess: Secondary | ICD-10-CM | POA: Diagnosis not present

## 2016-06-24 DIAGNOSIS — K6289 Other specified diseases of anus and rectum: Secondary | ICD-10-CM

## 2016-06-24 DIAGNOSIS — F17210 Nicotine dependence, cigarettes, uncomplicated: Secondary | ICD-10-CM | POA: Insufficient documentation

## 2016-06-24 DIAGNOSIS — F419 Anxiety disorder, unspecified: Secondary | ICD-10-CM | POA: Insufficient documentation

## 2016-06-24 DIAGNOSIS — K645 Perianal venous thrombosis: Secondary | ICD-10-CM | POA: Insufficient documentation

## 2016-06-24 DIAGNOSIS — Z7982 Long term (current) use of aspirin: Secondary | ICD-10-CM | POA: Diagnosis not present

## 2016-06-24 HISTORY — PX: EVALUATION UNDER ANESTHESIA WITH HEMORRHOIDECTOMY: SHX5624

## 2016-06-24 LAB — COMPREHENSIVE METABOLIC PANEL
ALT: 16 U/L (ref 14–54)
AST: 27 U/L (ref 15–41)
Albumin: 4.3 g/dL (ref 3.5–5.0)
Alkaline Phosphatase: 115 U/L (ref 38–126)
Anion gap: 13 (ref 5–15)
BUN: 8 mg/dL (ref 6–20)
CO2: 21 mmol/L — ABNORMAL LOW (ref 22–32)
Calcium: 9.3 mg/dL (ref 8.9–10.3)
Chloride: 101 mmol/L (ref 101–111)
Creatinine, Ser: 0.84 mg/dL (ref 0.44–1.00)
GFR calc Af Amer: >60 mL/min (ref >60)
GFR calc non Af Amer: >60 mL/min (ref >60)
Glucose, Bld: 92 mg/dL (ref 65–99)
Potassium: 3.3 mmol/L — ABNORMAL LOW (ref 3.5–5.1)
Sodium: 135 mmol/L (ref 135–145)
Total Bilirubin: 0.6 mg/dL (ref 0.3–1.2)
Total Protein: 8.1 g/dL (ref 6.5–8.1)

## 2016-06-24 LAB — LIPASE, BLOOD: Lipase: 18 U/L (ref 11–51)

## 2016-06-24 LAB — CBC
HCT: 25.5 % — ABNORMAL LOW (ref 35.0–47.0)
Hemoglobin: 7.7 g/dL — ABNORMAL LOW (ref 12.0–16.0)
MCH: 21.5 pg — ABNORMAL LOW (ref 26.0–34.0)
MCHC: 30.1 g/dL — ABNORMAL LOW (ref 32.0–36.0)
MCV: 71.5 fL — ABNORMAL LOW (ref 80.0–100.0)
Platelets: 437 10*3/uL (ref 150–440)
RBC: 3.56 MIL/uL — ABNORMAL LOW (ref 3.80–5.20)
RDW: 18.7 % — ABNORMAL HIGH (ref 11.5–14.5)
WBC: 8.5 10*3/uL (ref 3.6–11.0)

## 2016-06-24 LAB — TYPE AND SCREEN
ABO/RH(D): O POS
Antibody Screen: NEGATIVE

## 2016-06-24 SURGERY — EXAM UNDER ANESTHESIA WITH HEMORRHOIDECTOMY
Anesthesia: General

## 2016-06-24 MED ORDER — ONDANSETRON HCL 4 MG/2ML IJ SOLN
INTRAMUSCULAR | Status: AC
  Filled 2016-06-24: qty 2

## 2016-06-24 MED ORDER — DEXAMETHASONE SODIUM PHOSPHATE 10 MG/ML IJ SOLN
INTRAMUSCULAR | Status: DC | PRN
  Administered 2016-06-24: 5 mg via INTRAVENOUS

## 2016-06-24 MED ORDER — DEXTROSE 5 % IV SOLN
2.0000 g | Freq: Once | INTRAVENOUS | Status: AC
Start: 2016-06-24 — End: 2016-06-24
  Administered 2016-06-24: 2 g via INTRAVENOUS
  Filled 2016-06-24: qty 2

## 2016-06-24 MED ORDER — ONDANSETRON HCL 4 MG/2ML IJ SOLN
4.0000 mg | Freq: Once | INTRAMUSCULAR | Status: AC
  Administered 2016-06-24: 4 mg via INTRAVENOUS

## 2016-06-24 MED ORDER — HYDROMORPHONE HCL 1 MG/ML IJ SOLN
INTRAMUSCULAR | Status: AC
  Administered 2016-06-24: 1 mg via INTRAVENOUS
  Filled 2016-06-24: qty 1

## 2016-06-24 MED ORDER — OXYCODONE HCL 5 MG PO TABS
ORAL_TABLET | ORAL | Status: AC
  Filled 2016-06-24: qty 1

## 2016-06-24 MED ORDER — MEPERIDINE HCL 50 MG/ML IJ SOLN: 6.2500 mg | INTRAMUSCULAR | Status: DC | PRN

## 2016-06-24 MED ORDER — WITCH HAZEL-GLYCERIN EX PADS
MEDICATED_PAD | CUTANEOUS | Status: DC | PRN
  Filled 2016-06-24: qty 100

## 2016-06-24 MED ORDER — FENTANYL CITRATE (PF) 100 MCG/2ML IJ SOLN
INTRAMUSCULAR | Status: DC | PRN
  Administered 2016-06-24 (×2): 25 ug via INTRAVENOUS

## 2016-06-24 MED ORDER — PROMETHAZINE HCL 25 MG/ML IJ SOLN: 6.2500 mg | INTRAMUSCULAR | Status: DC | PRN

## 2016-06-24 MED ORDER — FENTANYL CITRATE (PF) 250 MCG/5ML IJ SOLN
INTRAMUSCULAR | Status: AC
  Filled 2016-06-24: qty 5

## 2016-06-24 MED ORDER — HYDROMORPHONE HCL 1 MG/ML IJ SOLN
1.0000 mg | Freq: Once | INTRAMUSCULAR | Status: AC
Start: 2016-06-24 — End: 2016-06-24
  Administered 2016-06-24: 1 mg via INTRAVENOUS
  Filled 2016-06-24: qty 1

## 2016-06-24 MED ORDER — BUPIVACAINE LIPOSOME 1.3 % IJ SUSP
INTRAMUSCULAR | Status: AC
  Filled 2016-06-24: qty 20

## 2016-06-24 MED ORDER — LIDOCAINE HCL 4 % EX SOLN
Freq: Once | CUTANEOUS | Status: AC
  Administered 2016-06-24: 16:00:00 via TOPICAL
  Filled 2016-06-24: qty 50

## 2016-06-24 MED ORDER — OXYCODONE-ACETAMINOPHEN 10-325 MG PO TABS: 1.0000 | ORAL_TABLET | ORAL | 0 refills | Status: DC | PRN

## 2016-06-24 MED ORDER — ONDANSETRON HCL 4 MG/2ML IJ SOLN
INTRAMUSCULAR | Status: AC
  Administered 2016-06-24: 4 mg via INTRAVENOUS
  Filled 2016-06-24: qty 2

## 2016-06-24 MED ORDER — HYDROCORTISONE ACETATE 25 MG RE SUPP
25.0000 mg | Freq: Once | RECTAL | Status: DC
  Filled 2016-06-24: qty 1

## 2016-06-24 MED ORDER — ONDANSETRON HCL 4 MG/2ML IJ SOLN
INTRAMUSCULAR | Status: DC | PRN
Start: 2016-06-24 — End: 2016-06-24
  Administered 2016-06-24: 4 mg via INTRAVENOUS

## 2016-06-24 MED ORDER — LACTATED RINGERS IV SOLN
INTRAVENOUS | Status: DC | PRN
  Administered 2016-06-24: 16:00:00 via INTRAVENOUS

## 2016-06-24 MED ORDER — MIDAZOLAM HCL 2 MG/2ML IJ SOLN
INTRAMUSCULAR | Status: DC | PRN
  Administered 2016-06-24: 2 mg via INTRAVENOUS

## 2016-06-24 MED ORDER — HYDROMORPHONE HCL 1 MG/ML IJ SOLN
1.0000 mg | Freq: Once | INTRAMUSCULAR | Status: AC
  Administered 2016-06-24: 1 mg via INTRAVENOUS

## 2016-06-24 MED ORDER — BUPIVACAINE-EPINEPHRINE (PF) 0.25% -1:200000 IJ SOLN
INTRAMUSCULAR | Status: AC
  Filled 2016-06-24: qty 30

## 2016-06-24 MED ORDER — OXYCODONE HCL 5 MG/5ML PO SOLN: 5.0000 mg | Freq: Once | ORAL | Status: AC | PRN

## 2016-06-24 MED ORDER — PROPOFOL 10 MG/ML IV BOLUS
INTRAVENOUS | Status: DC | PRN
  Administered 2016-06-24: 140 mg via INTRAVENOUS

## 2016-06-24 MED ORDER — LIDOCAINE HCL (PF) 2 % IJ SOLN
INTRAMUSCULAR | Status: AC
  Filled 2016-06-24: qty 2

## 2016-06-24 MED ORDER — PROPOFOL 10 MG/ML IV BOLUS
INTRAVENOUS | Status: AC
  Filled 2016-06-24: qty 40

## 2016-06-24 MED ORDER — BUPIVACAINE-EPINEPHRINE (PF) 0.25% -1:200000 IJ SOLN
INTRAMUSCULAR | Status: DC | PRN
  Administered 2016-06-24: 30 mL via PERINEURAL

## 2016-06-24 MED ORDER — DEXAMETHASONE SODIUM PHOSPHATE 10 MG/ML IJ SOLN
INTRAMUSCULAR | Status: AC
  Filled 2016-06-24: qty 1

## 2016-06-24 MED ORDER — BUPIVACAINE LIPOSOME 1.3 % IJ SUSP
INTRAMUSCULAR | Status: DC | PRN
  Administered 2016-06-24: 20 mL

## 2016-06-24 MED ORDER — KETOROLAC TROMETHAMINE 30 MG/ML IJ SOLN
INTRAMUSCULAR | Status: DC | PRN
  Administered 2016-06-24: 30 mg via INTRAVENOUS

## 2016-06-24 MED ORDER — FENTANYL CITRATE (PF) 100 MCG/2ML IJ SOLN: 25.0000 ug | INTRAMUSCULAR | Status: DC | PRN

## 2016-06-24 MED ORDER — OXYCODONE HCL 5 MG PO TABS
5.0000 mg | ORAL_TABLET | Freq: Once | ORAL | Status: AC | PRN
  Administered 2016-06-24: 5 mg via ORAL

## 2016-06-24 MED ORDER — MIDAZOLAM HCL 2 MG/2ML IJ SOLN
INTRAMUSCULAR | Status: AC
  Filled 2016-06-24: qty 2

## 2016-06-24 MED ORDER — LIDOCAINE HCL (CARDIAC) 20 MG/ML IV SOLN
INTRAVENOUS | Status: DC | PRN
  Administered 2016-06-24: 80 mg via INTRAVENOUS

## 2016-06-24 SURGICAL SUPPLY — 15 items
CANISTER SUCT 3000ML PPV (MISCELLANEOUS) ×3 IMPLANT
DRAPE LEGGINS SURG 28X43 STRL (DRAPES) ×3 IMPLANT
DRAPE SHEET LG 3/4 BI-LAMINATE (DRAPES) ×3 IMPLANT
DRAPE UNDER BUTTOCK W/FLU (DRAPES) ×3 IMPLANT
ELECT REM PT RETURN 9FT ADLT (ELECTROSURGICAL) ×3
ELECTRODE REM PT RTRN 9FT ADLT (ELECTROSURGICAL) ×1 IMPLANT
GLOVE BIO SURGEON STRL SZ7 (GLOVE) ×3 IMPLANT
GOWN STRL REUS W/ TWL LRG LVL3 (GOWN DISPOSABLE) ×2 IMPLANT
GOWN STRL REUS W/TWL LRG LVL3 (GOWN DISPOSABLE) ×4
JELLY LUB 2OZ STRL (MISCELLANEOUS) ×2
JELLY LUBE 2OZ STRL (MISCELLANEOUS) ×1 IMPLANT
NS IRRIG 1000ML POUR BTL (IV SOLUTION) ×3 IMPLANT
PACK BASIN MINOR ARMC (MISCELLANEOUS) ×3 IMPLANT
SOL PREP PVP 2OZ (MISCELLANEOUS) ×3
SOLUTION PREP PVP 2OZ (MISCELLANEOUS) ×1 IMPLANT

## 2016-06-24 NOTE — ED Notes (Signed)
Pt continues to c/o rectal pain, states it comes and goes.  At times she is tearful, other times she is able to lay down and tolerate the pain. States the pain medication she was given is starting to wear off.

## 2016-06-24 NOTE — Transfer of Care (Signed)
Immediate Anesthesia Transfer of Care Note  Patient: Alicia Costa Manufacturing engineer  Procedure(s) Performed: Procedure(s): EXAM UNDER ANESTHESIA WITH HEMORRHOIDECTOMY (N/A)  Patient Location: PACU  Anesthesia Type:General  Level of Consciousness: awake  Airway & Oxygen Therapy: Patient connected to face mask oxygen  Post-op Assessment: Post -op Vital signs reviewed and stable  Post vital signs: stable  Last Vitals:  Vitals:   06/24/16 1545 06/24/16 1709  BP: (!) 181/79 140/81  Pulse: 88 (!) 101  Resp: (!) 22 11  Temp:      Last Pain:  Vitals:   06/24/16 1543  TempSrc:   PainSc: 10-Worst pain ever         Complications: No apparent anesthesia complications

## 2016-06-24 NOTE — ED Notes (Signed)
Updated patient on delay, pt continues to have pain. Pt given a second dose of dilaudid.  Pt unable to urinate at this time states its too painful to get up.  Informed her that Dr. Shaune Pollack would be coming back to evaluate her.

## 2016-06-24 NOTE — ED Triage Notes (Addendum)
Patient presents to ER with c/o abdominal pain and rectum pain X 3 days with worsening pain today. Recently pregnant; gave birth X1 month ago. HX hemorrhoids. Last BM X3 days ago. Pt crying and very uncomfortable in triage. Denies N/V

## 2016-06-24 NOTE — Anesthesia Post-op Follow-up Note (Cosign Needed)
Anesthesia QCDR form completed.        

## 2016-06-24 NOTE — ED Notes (Signed)
Pt standing in room in bra and sweatpants, tearful, c/o rectal pain that started last night, able to change patient into gown and encouraged to lie on her side.

## 2016-06-24 NOTE — H&P (Addendum)
Patient ID: Alicia Costa, female   DOB: [birth year], 21 y.o.   MRN: [id number]  HPI Alicia Costa is a 21 y.o. female asked to see in consultation by Dr. Vernona Rieger for anorectal pain. She reports that the pain started about 2-3 days ago and has intensified over the last 24 hours. She reports her last 24-hour constant anorectal severe pain, sharp and worsening when she sits down or apply pressure. No fevers no chills no nausea or vomiting. The pain is nonradiating. She did have a vaginal delivery 4 weeks ago. And she has had previous hemorrhoid issues in the past but this is the worst. She has good cardiovascular reserve and is able to perform more than 4 Mets without shortness of breath or chest pain. She does have significant anxiety and is very tearful at this time. Wbc and creat nml.  HPI  History reviewed. No pertinent past medical history.  History reviewed. No pertinent surgical history.  Family History  Problem Relation Age of Onset  . Cancer Maternal Grandmother   . Cancer Maternal Grandfather   . Hypertension Paternal Grandmother   . Diabetes Paternal Grandmother     Social History Social History  Substance Use Topics  . Smoking status: Current Every Day Smoker    Packs/day: 0.25    Types: Cigarettes  . Smokeless tobacco: Never Used  . Alcohol use Yes     Comment: sometimes    Allergies  Allergen Reactions  . Sulfa Antibiotics     Pt states she is not sure what her allergic reaction was. atates something about having a seizure but just keeps saying, "I don't know."     Current Facility-Administered Medications  Medication Dose Route Frequency Provider Last Rate Last Dose  . cefoTEtan (CEFOTAN) 2 g in dextrose 5 % 50 mL IVPB  2 g Intravenous Once Leafy Ro, MD      . witch hazel-glycerin (TUCKS) pad   Topical PRN Governor Rooks, MD       Current Outpatient Prescriptions  Medication Sig Dispense Refill  . Aspirin-Acetaminophen (GOODYS BODY PAIN PO) Take 1  packet by mouth as needed.    . docusate sodium (COLACE) 100 MG capsule Take 100 mg by mouth daily as needed for mild constipation.    . sertraline (ZOLOFT) 25 MG tablet Take 25 mg by mouth daily.    . naproxen (NAPROSYN) 500 MG tablet Take 1 tablet (500 mg total) by mouth 2 (two) times daily with a meal. 20 tablet 00     Review of Systems Full ROS  was asked and was negative except for the information on the HPI  Physical Exam Blood pressure (!) 181/79, pulse 88, temperature 98.7 F (37.1 C), temperature source Oral, resp. rate (!) 22, height  (1.727 m), weight 77.1 kg (170 lb), last menstrual period 08/24/2015, SpO2 100 %, unknown if currently breastfeeding. CONSTITUTIONAL: Very anxious person and tearful. EYES: Pupils are equal, round, and reactive to light, Sclera are non-icteric. EARS, NOSE, MOUTH AND THROAT: The oropharynx is clear. The oral mucosa is pink and moist. Hearing is intact to voice. LYMPH NODES:  Lymph nodes in the neck are normal. RESPIRATORY:  Lungs are clear. There is normal respiratory effort, with equal breath sounds bilaterally, and without pathologic use of accessory muscles. CARDIOVASCULAR: Heart is regular without murmurs, gallops, or rubs. GI: The abdomen is  soft, nontender, and nondistended. There are no palpable masses. There is no hepatosplenomegaly. There are normal bowel sounds in all quadrants.  Rectal: It is uncomfortable but I was able to visualize what it looks like a thrombosed hemorrhoid on the right lateral aspect. There is no overt signs of necrotizing infection or abscess however my exam is limited to her exquisite pain   MUSCULOSKELETAL: Normal muscle strength and tone. No cyanosis or edema.   SKIN: Turgor is good and there are no pathologic skin lesions or ulcers. NEUROLOGIC: Motor and sensation is grossly normal. Cranial nerves are grossly intact. PSYCH:  Oriented to person, place and time. Affect is normal.  Data Reviewed  I have  personally reviewed the patient's imaging, laboratory findings and medical records.    Assessment/Plan Severe anorectal pain consistent with thrombosed hemorrhoid versus abscess. Discussed with the patient in detail and I think the most prudent step is to do an exam under anesthesia and depending on findings will tailor therapy. More likely she will need an excision of the thrombosed hemorrhoid or abscess. Discussed with the patient detail about the procedure length. Risk, benefits and possible complications including but not limited to: Bleeding, infection, prolonged pain, recurrence. She understands and wishes to proceed. Due to the severity of her pain an acuity this procedure is urgent.  Sterling Big, MD FACS General Surgeon 06/24/2016, 3:47 PM

## 2016-06-24 NOTE — OR Nursing (Signed)
Patient is very anxoius crying at time  Patient vss

## 2016-06-24 NOTE — ED Notes (Signed)
Pt states she had a baby about a month ago at [facility], states she has hx of hemorrhoids, but states she has never had pain this bad before. States she has been having lower abdominal pain and also gas. States she is still having vaginal bleeding from pregnancy, but has remained unchanged and is minimal. She is not breastfeeding at this time.

## 2016-06-24 NOTE — Anesthesia Procedure Notes (Signed)
Procedure Name: LMA Insertion Date/Time: 06/24/2016 4:32 PM Performed by: Irving Burton Pre-anesthesia Checklist: Patient identified Patient Re-evaluated:Patient Re-evaluated prior to inductionOxygen Delivery Method: Circle system utilized Preoxygenation: Pre-oxygenation with 100% oxygen Intubation Type: IV induction Ventilation: Mask ventilation without difficulty LMA: LMA inserted LMA Size: 3.5 Number of attempts: 1 Placement Confirmation: positive ETCO2 and breath sounds checked- equal and bilateral Tube secured with: Tape Dental Injury: Teeth and Oropharynx as per pre-operative assessment

## 2016-06-24 NOTE — ED Provider Notes (Addendum)
[facility] Emergency Department Provider Note ____________________________________________   I have reviewed the triage vital signs and the triage nursing note.  HISTORY  Chief Complaint Rectal Pain and Abdominal Pain   Historian History limited due to severe pain, history from Patient  HPI Alicia Costa is a 21 y.o. female presents with severe rectal pain has been worsening over 3 days.  She does have a history of hemorrhoids. She states this is the worst ever been. She delivered a baby about 1 month ago. Mild lower abdominal pain but mostly pain is at the rectum. Pain is severe. She is hardly able to move. Any movement makes it worse.    History reviewed. No pertinent past medical history.  Patient Active Problem List   Diagnosis Date Noted  . Indication for care or intervention related to labor and delivery 11/22/2014  . False labor after 37 completed weeks of gestation 11/20/2014  . Pelvic pain affecting pregnancy 10/18/2014  . Indication for care in labor and delivery, antepartum 10/18/2014  . Pregnancy 07/28/2014  . Pyelonephritis 07/28/2014  . Pyelonephritis affecting pregnancy in second trimester, antepartum 07/23/2014    History reviewed. No pertinent surgical history.  Prior to Admission medications   Medication Sig Start Date End Date Taking? Authorizing Provider  Aspirin-Acetaminophen (GOODYS BODY PAIN PO) Take 1 packet by mouth as needed.   Yes Historical Provider, MD  docusate sodium (COLACE) 100 MG capsule Take 100 mg by mouth daily as needed for mild constipation.   Yes Historical Provider, MD  sertraline (ZOLOFT) 25 MG tablet Take 25 mg by mouth daily. 05/23/16  Yes Historical Provider, MD  naproxen (NAPROSYN) 500 MG tablet Take 1 tablet (500 mg total) by mouth 2 (two) times daily with a meal. 09/29/15   Joni Reining, PA-C    Allergies  Allergen Reactions  . Sulfa Antibiotics     Pt states she is not sure what her allergic  reaction was. atates something about having a seizure but just keeps saying, "I don't know."     Family History  Problem Relation Age of Onset  . Cancer Maternal Grandmother   . Cancer Maternal Grandfather   . Hypertension Paternal Grandmother   . Diabetes Paternal Grandmother     Social History Social History  Substance Use Topics  . Smoking status: Current Every Day Smoker    Packs/day: 0.25    Types: Cigarettes  . Smokeless tobacco: Never Used  . Alcohol use Yes     Comment: sometimes    Review of Systems  Constitutional: Negative for fever. Eyes: Negative for visual changes. ENT: Negative for sore throat. Cardiovascular: Negative for chest pain. Respiratory: Negative for shortness of breath. Gastrointestinal: Negative for diarrhea. Genitourinary: Negative for dysuria. Musculoskeletal: Negative for back pain. Skin: Negative for rash. Neurological: Negative for headache. 10 point Review of Systems otherwise negative ____________________________________________   PHYSICAL EXAM:  VITAL SIGNS: ED Triage Vitals  Enc Vitals Group     BP 06/24/16 0953 (!) 140/100     Pulse Rate 06/24/16 0953 (!) 104     Resp 06/24/16 0953 (!) 22     Temp 06/24/16 0953 98.7 F (37.1 C)     Temp Source 06/24/16 0953 Oral     SpO2 06/24/16 0953 98 %     Weight 06/24/16 0953 170 lb (77.1 kg)     Height 06/24/16 0953  (1.727 m)     Head Circumference --      Peak Flow --  Pain Score 06/24/16 0954 10     Pain Loc --      Pain Edu? --      Excl. in GC? --      Constitutional: Alert and oriented. Tearful and crying in severe pain. HEENT   Head: Normocephalic and atraumatic.      Eyes: Conjunctivae are normal. PERRL. Normal extraocular movements.      Ears:         Nose: No congestion/rhinnorhea.   Mouth/Throat: Mucous membranes are moist.   Neck: No stridor. Cardiovascular/Chest: Normal rate, regular rhythm.  No murmurs, rubs, or gallops. Respiratory: Normal  respiratory effort without tachypnea nor retractions. Breath sounds are clear and equal bilaterally. No wheezes/rales/rhonchi. Gastrointestinal: Soft. No distention, no guarding, no rebound. Mild lower abdominal tenderness.  Genitourinary/rectal: Large whitish soft tissue about 3-4cm long, exquisitely tender.  Non bloody appearance Musculoskeletal: Nontender with normal range of motion in all extremities. No joint effusions.  No lower extremity tenderness.  No edema. Neurologic:  Normal speech and language. No gross or focal neurologic deficits are appreciated. Skin:  Skin is warm, dry and intact. No rash noted. Psychiatric: Mood and affect are normal. Speech and behavior are normal. Patient exhibits appropriate insight and judgment.   ____________________________________________  LABS (pertinent positives/negatives)  Labs Reviewed  COMPREHENSIVE METABOLIC PANEL - Abnormal; Notable for the following:       Result Value   Potassium 3.3 (*)    CO2 21 (*)    All other components within normal limits  CBC - Abnormal; Notable for the following:    RBC 3.56 (*)    Hemoglobin 7.7 (*)    HCT 25.5 (*)    MCV 71.5 (*)    MCH 21.5 (*)    MCHC 30.1 (*)    RDW 18.7 (*)    All other components within normal limits  LIPASE, BLOOD  URINALYSIS, COMPLETE (UACMP) WITH MICROSCOPIC  TYPE AND SCREEN    ____________________________________________    EKG I, Governor Rooks, MD, the attending physician have personally viewed and interpreted all ECGs.  None ____________________________________________  RADIOLOGY All Xrays were viewed by me. Imaging interpreted by Radiologist.  None __________________________________________  PROCEDURES  Procedure(s) performed: None  Critical Care performed: None  ____________________________________________   ED COURSE / ASSESSMENT AND PLAN  Pertinent labs & imaging results that were available during my care of the patient were reviewed by me and  considered in my medical decision making (see chart for details).   [name] is complaining of severe rectal pain with a history of hemorrhoids that, suspecting likely symptomatic hemorrhoids. No significant abdominal pain. She was in such distress that she was given IV Dilaudid initially and then was resting comfortably on her back but started crying and screaming in pain when I tried to roll her over to take a look. Next the next line she did receive a second dose of IV Dilaudid. At this point she was able to very barely stand up on her own with significant pain and hold the cheeks apart so that he got a quick visualization of a large soft tissue mass which I presume to be a hemorrhoid. It is not purple and is not obviously thrombosed, but it is extremely tense and exquisitely tender.  She was not allowing me to visualize any further. And did order multiple medications to help with superficial/topical numbing including hydrocortisone, lidocaine, and witch hazel.  Given the size, and not clearly purple/thrombosed, I did request surgical consultation for further management.  Emergency Department care transferred to Dr. Lenard Lance at shift change 3 PM. General surgery consultation pending. Disposition per patient reevaluation and general surgery consultation.    CONSULTATIONS:  Dr. Everlene Farrier, General Surgery.   Patient / Family / Caregiver informed of clinical course, medical decision-making process, and agree with plan.  ___________________________________________   FINAL CLINICAL IMPRESSION(S) / ED DIAGNOSES   Final diagnoses:  Hemorrhoids, unspecified hemorrhoid type  Rectal pain              Note: This dictation was prepared with Dragon dictation. Any transcriptional errors that result from this process are unintentional    Governor Rooks, MD 06/24/16 1502    Governor Rooks, MD 06/24/16 [phone]

## 2016-06-24 NOTE — Op Note (Signed)
  06/24/2016  5:06 PM  PATIENT:  [place]  21 y.o. female  PRE-OPERATIVE DIAGNOSIS:  Pilonidal abscess  POST-OPERATIVE DIAGNOSIS:  Same  PROCEDURE:  1.Excision of thrombosed external hemorrhoid x 2. Left and right lateral     SURGEON:  Surgeon(s) and Role:    * Gaia Gullikson F Benecio Kluger, MD - Primary   ANESTHESIA: GETA  INDICATIONS FOR PROCEDURE severe anorectal pain  DICTATION:  Patient was playing about proceeding detail, risk benefits possible complications and a consent was obtained. The patient taken to the operating room and placed in the lithotomy position.  Two thrombosed external hemorrhoids were visualized. Using electrocautery we excised them and drained the clots. Hemostasis was obtained. Identical excision were performed for both hemorrhoids.  Hemostasis was obtained with electrocautery. Irrigation with normal saline performed. Liposomal marcaine injected around the wound site. Needle and laparotomy counts were correct and there were no immediate complications  Leafy Ro, MD

## 2016-06-24 NOTE — Anesthesia Postprocedure Evaluation (Signed)
Anesthesia Post Note  Patient: Alicia Costa  Procedure(s) Performed: Procedure(s) (LRB): EXAM UNDER ANESTHESIA WITH HEMORRHOIDECTOMY (N/A)  Patient location during evaluation: PACU Anesthesia Type: General Level of consciousness: awake and alert and oriented Pain management: pain level controlled Vital Signs Assessment: post-procedure vital signs reviewed and stable Respiratory status: spontaneous breathing, nonlabored ventilation and respiratory function stable Cardiovascular status: blood pressure returned to baseline and stable Postop Assessment: no signs of nausea or vomiting Anesthetic complications: no     Last Vitals:  Vitals:   06/24/16 1545 06/24/16 1709  BP: (!) 181/79 140/81  Pulse: 88 (!) 101  Resp: (!) 22 11  Temp:  36.7 C    Last Pain:  Vitals:   06/24/16 1730  TempSrc:   PainSc: 0-No pain                 Alicia Costa

## 2016-06-24 NOTE — OR Nursing (Signed)
Unable to retreive B/P only 2 recorded

## 2016-06-24 NOTE — Anesthesia Preprocedure Evaluation (Signed)
Anesthesia Evaluation  Patient identified by MRN, date of birth, ID band Patient awake    Reviewed: Allergy & Precautions, NPO status , Patient's Chart, lab work & pertinent test results  History of Anesthesia Complications Negative for: history of anesthetic complications  Airway Mallampati: II  TM Distance: >3 FB Neck ROM: Full    Dental no notable dental hx.    Pulmonary neg sleep apnea, neg COPD, Current Smoker,    breath sounds clear to auscultation- rhonchi (-) wheezing      Cardiovascular Exercise Tolerance: Good (-) hypertension(-) CAD and (-) Past MI  Rhythm:Regular Rate:Normal - Systolic murmurs and - Diastolic murmurs    Neuro/Psych negative neurological ROS  negative psych ROS   GI/Hepatic Neg liver ROS, Painful, thrombosed hemorrhoid    Endo/Other  negative endocrine ROSneg diabetes  Renal/GU negative Renal ROS     Musculoskeletal negative musculoskeletal ROS (+)   Abdominal (+) - obese,   Peds  Hematology negative hematology ROS (+)   Anesthesia Other Findings    Reproductive/Obstetrics                             Anesthesia Physical Anesthesia Plan  ASA: II and emergent  Anesthesia Plan: General   Post-op Pain Management:    Induction: Intravenous  Airway Management Planned: LMA  Additional Equipment:   Intra-op Plan:   Post-operative Plan:   Informed Consent: I have reviewed the patients History and Physical, chart, labs and discussed the procedure including the risks, benefits and alternatives for the proposed anesthesia with the patient or authorized representative who has indicated his/her understanding and acceptance.   Dental advisory given  Plan Discussed with: CRNA and Anesthesiologist  Anesthesia Plan Comments:         Anesthesia Quick Evaluation

## 2016-06-24 NOTE — ED Provider Notes (Signed)
-----------------------------------------   3:40 PM on 06/24/2016 -----------------------------------------  Patient care assumed from Dr. Shaune Pollack. Dr. Everlene Farrier has seen and evaluated the patient, states they will be admitting to their service to take to the operating room given the patient's significant pain for a more detailed evaluation/examination and hopefully treatment.   Minna Antis, MD 06/24/16 [phone]

## 2016-06-26 LAB — SURGICAL PATHOLOGY

## 2016-07-07 ENCOUNTER — Emergency Department
Admission: EM | Admit: 2016-07-07 | Discharge: 2016-07-07 | Disposition: A | Discharge status: Home / Self Care | Payer: Medicaid Other | Attending: Emergency Medicine | Admitting: Emergency Medicine

## 2016-07-07 ENCOUNTER — Encounter: Payer: Self-pay | Admitting: Emergency Medicine

## 2016-07-07 DIAGNOSIS — K5900 Constipation, unspecified: Secondary | ICD-10-CM | POA: Insufficient documentation

## 2016-07-07 DIAGNOSIS — F17210 Nicotine dependence, cigarettes, uncomplicated: Secondary | ICD-10-CM | POA: Diagnosis not present

## 2016-07-07 DIAGNOSIS — Z5321 Procedure and treatment not carried out due to patient leaving prior to being seen by health care provider: Secondary | ICD-10-CM | POA: Insufficient documentation

## 2016-07-07 NOTE — ED Triage Notes (Signed)
Pt arrives ambulatory to triage with c/o constipation. Pt reports that she had hemorrhoids removed x2 weeks ago and was given percocet for the pain. Pt states that it has been x3 days since her last BM. Pt is upset and tearful in triage at this time.

## 2016-07-09 ENCOUNTER — Telehealth: Payer: Self-pay | Admitting: Emergency Medicine

## 2016-10-30 ENCOUNTER — Emergency Department
Admission: EM | Admit: 2016-10-30 | Discharge: 2016-10-30 | Disposition: A | Discharge status: Home / Self Care | Payer: No Typology Code available for payment source

## 2017-03-03 ENCOUNTER — Emergency Department
Admission: EM | Admit: 2017-03-03 | Discharge: 2017-03-04 | Disposition: A | Discharge status: Home / Self Care | Payer: Medicaid Other | Attending: Emergency Medicine | Admitting: Emergency Medicine

## 2017-03-03 DIAGNOSIS — F329 Major depressive disorder, single episode, unspecified: Secondary | ICD-10-CM

## 2017-03-03 DIAGNOSIS — F1410 Cocaine abuse, uncomplicated: Secondary | ICD-10-CM

## 2017-03-03 DIAGNOSIS — T50904A Poisoning by unspecified drugs, medicaments and biological substances, undetermined, initial encounter: Secondary | ICD-10-CM | POA: Insufficient documentation

## 2017-03-03 DIAGNOSIS — F17210 Nicotine dependence, cigarettes, uncomplicated: Secondary | ICD-10-CM | POA: Diagnosis not present

## 2017-03-03 DIAGNOSIS — F32A Depression, unspecified: Secondary | ICD-10-CM

## 2017-03-03 DIAGNOSIS — F331 Major depressive disorder, recurrent, moderate: Secondary | ICD-10-CM

## 2017-03-03 DIAGNOSIS — T50901A Poisoning by unspecified drugs, medicaments and biological substances, accidental (unintentional), initial encounter: Secondary | ICD-10-CM

## 2017-03-03 DIAGNOSIS — Z79899 Other long term (current) drug therapy: Secondary | ICD-10-CM | POA: Insufficient documentation

## 2017-03-03 DIAGNOSIS — F1010 Alcohol abuse, uncomplicated: Secondary | ICD-10-CM

## 2017-03-03 LAB — COMPREHENSIVE METABOLIC PANEL
ALT: 31 U/L (ref 14–54)
AST: 46 U/L — ABNORMAL HIGH (ref 15–41)
Albumin: 4.1 g/dL (ref 3.5–5.0)
Alkaline Phosphatase: 94 U/L (ref 38–126)
Anion gap: 10 (ref 5–15)
BUN: 8 mg/dL (ref 6–20)
CO2: 24 mmol/L (ref 22–32)
Calcium: 8.7 mg/dL — ABNORMAL LOW (ref 8.9–10.3)
Chloride: 109 mmol/L (ref 101–111)
Creatinine, Ser: 0.67 mg/dL (ref 0.44–1.00)
GFR calc Af Amer: >60 mL/min (ref >60)
GFR calc non Af Amer: >60 mL/min (ref >60)
Glucose, Bld: 109 mg/dL — ABNORMAL HIGH (ref 65–99)
Potassium: 4 mmol/L (ref 3.5–5.1)
Sodium: 143 mmol/L (ref 135–145)
Total Bilirubin: 0.4 mg/dL (ref 0.3–1.2)
Total Protein: 8.2 g/dL — ABNORMAL HIGH (ref 6.5–8.1)

## 2017-03-03 LAB — POCT PREGNANCY, URINE: Preg Test, Ur: NEGATIVE

## 2017-03-03 LAB — URINE DRUG SCREEN, QUALITATIVE (ARMC ONLY)
Amphetamines, Ur Screen: NOT DETECTED
Barbiturates, Ur Screen: NOT DETECTED
Benzodiazepine, Ur Scrn: NOT DETECTED
Cannabinoid 50 Ng, Ur ~~LOC~~: POSITIVE — AB
Cocaine Metabolite,Ur ~~LOC~~: POSITIVE — AB
MDMA (Ecstasy)Ur Screen: NOT DETECTED
Methadone Scn, Ur: NOT DETECTED
Opiate, Ur Screen: NOT DETECTED
Phencyclidine (PCP) Ur S: NOT DETECTED
Tricyclic, Ur Screen: NOT DETECTED

## 2017-03-03 LAB — GLUCOSE, CAPILLARY
Glucose-Capillary: 105 mg/dL — ABNORMAL HIGH (ref 65–99)
Glucose-Capillary: 94 mg/dL (ref 65–99)
Glucose-Capillary: 83 mg/dL (ref 65–99)
Glucose-Capillary: 76 mg/dL (ref 65–99)
Glucose-Capillary: 77 mg/dL (ref 65–99)
Glucose-Capillary: 77 mg/dL (ref 65–99)
Glucose-Capillary: 86 mg/dL (ref 65–99)
Glucose-Capillary: 76 mg/dL (ref 65–99)
Glucose-Capillary: 93 mg/dL (ref 65–99)
Glucose-Capillary: 91 mg/dL (ref 65–99)
Glucose-Capillary: 70 mg/dL (ref 65–99)
Glucose-Capillary: 115 mg/dL — ABNORMAL HIGH (ref 65–99)

## 2017-03-03 LAB — SALICYLATE LEVEL: Salicylate Lvl: <7 mg/dL (ref 2.8–30.0)

## 2017-03-03 LAB — CBC
HCT: 32.9 % — ABNORMAL LOW (ref 35.0–47.0)
Hemoglobin: 10.3 g/dL — ABNORMAL LOW (ref 12.0–16.0)
MCH: 23.4 pg — ABNORMAL LOW (ref 26.0–34.0)
MCHC: 31.5 g/dL — ABNORMAL LOW (ref 32.0–36.0)
MCV: 74.5 fL — ABNORMAL LOW (ref 80.0–100.0)
Platelets: 266 10*3/uL (ref 150–440)
RBC: 4.41 MIL/uL (ref 3.80–5.20)
RDW: 20.5 % — ABNORMAL HIGH (ref 11.5–14.5)
WBC: 7.5 10*3/uL (ref 3.6–11.0)

## 2017-03-03 LAB — ACETAMINOPHEN LEVEL: Acetaminophen (Tylenol), Serum: <10 ug/mL — ABNORMAL LOW (ref 10–30)

## 2017-03-03 LAB — ETHANOL: Alcohol, Ethyl (B): 157 mg/dL — ABNORMAL HIGH (ref <10)

## 2017-03-03 MED ORDER — FLUOXETINE HCL 20 MG PO CAPS
20.0000 mg | ORAL_CAPSULE | Freq: Every day | ORAL | Status: DC
  Administered 2017-03-03 (×2): 20 mg via ORAL
  Filled 2017-03-03 (×2): qty 1

## 2017-03-03 MED ORDER — OLANZAPINE 5 MG PO TABS
5.0000 mg | ORAL_TABLET | Freq: Every day | ORAL | Status: DC
  Administered 2017-03-03 (×2): 5 mg via ORAL
  Filled 2017-03-03 (×2): qty 1

## 2017-03-03 MED ORDER — ONDANSETRON HCL 4 MG/2ML IJ SOLN
INTRAMUSCULAR | Status: AC
  Administered 2017-03-03: 09:00:00
  Filled 2017-03-03: qty 2

## 2017-03-03 MED ORDER — LORAZEPAM 2 MG PO TABS: 2.0000 mg | ORAL_TABLET | Freq: Four times a day (QID) | ORAL | Status: DC | PRN

## 2017-03-03 NOTE — ED Provider Notes (Signed)
[facility] Emergency Department Provider Note   ____________________________________________   I have reviewed the triage vital signs and the nursing notes.   HISTORY  Chief Complaint Drug Overdose   History limited by: Not Limited   HPI Alicia Costa is a 22 y.o. female who presents to the emergency department today under IVC after intentional overdose of mothers medication. Patient states she has a history of depression and sees her PCP for it. States that things got worse today. That today was a bad day. States she took a number of her mother's medication. Did it because she wanted "to feel better". However does not know what the medication she took does. States she has a history of cutting herself but denies any cutting today. Denies any recent medical complaints.    Per medical record review patient has a history of psychiatric issues.   History reviewed. No pertinent past medical history.  Patient Active Problem List   Diagnosis Date Noted  . Hemorrhoids   . Indication for care or intervention related to labor and delivery 11/22/2014  . False labor after 37 completed weeks of gestation 11/20/2014  . Pelvic pain affecting pregnancy 10/18/2014  . Indication for care in labor and delivery, antepartum 10/18/2014  . Pregnancy 07/28/2014  . Pyelonephritis 07/28/2014  . Pyelonephritis affecting pregnancy in second trimester, antepartum 07/23/2014    Past Surgical History:  Procedure Laterality Date  . EVALUATION UNDER ANESTHESIA WITH HEMORRHOIDECTOMY N/A 06/24/2016   Procedure: EXAM UNDER ANESTHESIA WITH HEMORRHOIDECTOMY;  Surgeon: Leafy Roiego F Pabon, MD;  Location: [facility] ORS;  Service: General;  Laterality: N/A;    Prior to Admission medications   Medication Sig Start Date End Date Taking? Authorizing Provider  docusate sodium (COLACE) 100 MG capsule Take 100 mg by mouth daily as needed for mild constipation.    [provider]  naproxen  (NAPROSYN) 500 MG tablet Take 1 tablet (500 mg total) by mouth 2 (two) times daily with a meal. 09/29/15   Joni ReiningSmith, Ronald K, PA-C  oxyCODONE-acetaminophen (PERCOCET) 10-325 MG tablet Take 1 tablet by mouth every 4 (four) hours as needed for pain (may take 2 for severe pain). 06/24/16   Pabon, Diego F, MD  sertraline (ZOLOFT) 25 MG tablet Take 25 mg by mouth daily. 05/23/16   [provider]    Allergies Sulfa antibiotics  Family History  Problem Relation Age of Onset  . Cancer Maternal Grandmother   . Cancer Maternal Grandfather   . Hypertension Paternal Grandmother   . Diabetes Paternal Grandmother     Social History Social History   Tobacco Use  . Smoking status: Current Every Day Smoker    Packs/day: 0.25    Types: Cigarettes  . Smokeless tobacco: Never Used  Substance Use Topics  . Alcohol use: Yes    Comment: everyweekend  . Drug use: Yes    Frequency: 7.0 times per week    Types: Marijuana    Review of Systems Constitutional: No fever/chills Eyes: No visual changes. ENT: No sore throat. Cardiovascular: Denies chest pain. Respiratory: Denies shortness of breath. Gastrointestinal: No abdominal pain.  No nausea, no vomiting.  No diarrhea.   Genitourinary: Negative for dysuria. Musculoskeletal: Negative for back pain. Skin: Negative for rash. Neurological: Negative for headaches, focal weakness or numbness. Psychiatric: Positive for depression. ____________________________________________   PHYSICAL EXAM:  VITAL SIGNS: ED Triage Vitals  Enc Vitals Group     BP 03/03/17 0658 (!) 166/110     Pulse Rate 03/03/17 [id number] Marland Kitchen(!)  121     Resp --      Temp 03/03/17 0658 97.8 F (36.6 C)     Temp Source 03/03/17 0658 Oral     SpO2 03/03/17 0658 96 %     Weight 03/03/17 0703 170 lb (77.1 kg)     Height 03/03/17 0703 5\' 9"  (1.753 m)   Constitutional: Awake and alert. Depressed. Tearful.  Eyes: Conjunctivae are normal.  ENT   Head: Normocephalic and  atraumatic.   Nose: No congestion/rhinnorhea.   Mouth/Throat: Mucous membranes are moist.   Neck: No stridor. Hematological/Lymphatic/Immunilogical: No cervical lymphadenopathy. Cardiovascular: Normal rate, regular rhythm.  No murmurs, rubs, or gallops.  Respiratory: Normal respiratory effort without tachypnea nor retractions. Breath sounds are clear and equal bilaterally. No wheezes/rales/rhonchi. Gastrointestinal: Soft and non tender. No rebound. No guarding.  Genitourinary: Deferred Musculoskeletal: Normal range of motion in all extremities. No lower extremity edema. Neurologic:  Normal speech and language. No gross focal neurologic deficits are appreciated.  Skin:  Skin is warm, dry and intact. No rash noted. Psychiatric: Depressed, tearful.   ____________________________________________    LABS (pertinent positives/negatives)  UDS pos cocaine, cannabinoid Upreg neg Glu 83-76-77-77 Ethanol 157 Aslicylate, tylenol negative CBC hgb 10.3, wbc 7.5 CMP cr 0.67 ____________________________________________   EKG  I, Phineas Semen, attending physician, personally viewed and interpreted this EKG  EKG Time: 0714 Rate: 100 Rhythm: sinus tachycardia Axis: right axis deviation Intervals: qtc 445 QRS: narrow ST changes: no st elevation Impression: abnormal ekg   ____________________________________________    RADIOLOGY  None  ____________________________________________   PROCEDURES  Procedures  ____________________________________________   INITIAL IMPRESSION / ASSESSMENT AND PLAN / ED COURSE  Pertinent labs & imaging results that were available during my care of the patient were reviewed by me and considered in my medical decision making (see chart for details).  Patient presents to the emergency department under IVC after intentional overdose of mother's medication. On exam patient does appear quite depressed and tearful. Will continue IVC and have  psychiatry evaluate. Will do frequent CBG checks.    ____________________________________________   FINAL CLINICAL IMPRESSION(S) / ED DIAGNOSES  Final diagnoses:  Drug overdose, undetermined intent, initial encounter  Depression, unspecified depression type     Note: This dictation was prepared with Dragon dictation. Any transcriptional errors that result from this process are unintentional     Phineas Semen, MD 03/03/17 1458

## 2017-03-03 NOTE — ED Notes (Signed)
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 Sitter at bedside at this time.

## 2017-03-03 NOTE — ED Notes (Signed)
Pt alert with Family at bedside. Pt family was not aware of the visitation times. This RN allowed 1 vistor in to see pt, and also education and gave paper on call information and visitation hours.

## 2017-03-03 NOTE — ED Notes (Signed)
pt personal belongings under desk. Pt belonging in bag are 1 shirt 1 bra 1 underwear 1 jean. Lanora ManisElizabeth RN aware

## 2017-03-03 NOTE — ED Notes (Signed)
Report was received from Dorise HissElizabeth C., RN; Pt. Verbalizes  complaints of Depression; and distress of family and relationship; denies S.I.; despite having taken an overdose of her mother's Metformin 500 mg tablets. Patient states, "I was not trying to kill myself."; denies having Hi. Continue to monitor with 15 min. Monitoring.

## 2017-03-03 NOTE — ED Notes (Signed)
Pt is alert sitting in bed. Pt was given sandwich tray at this time. Sitter at bedside.

## 2017-03-03 NOTE — ED Notes (Signed)
ENVIRONMENTAL ASSESSMENT  Potentially harmful objects out of patient reach: Yes.  Personal belongings secured: Yes.  Patient dressed in hospital provided attire only: Yes.  Plastic bags out of patient reach: Yes.  Patient care equipment (cords, cables, call bells, lines, and drains) shortened, removed, or accounted for: Yes.  Equipment and supplies removed from bottom of stretcher: Yes.  Potentially toxic materials out of patient reach: Yes.  Sharps container removed or out of patient reach: Yes.    BEHAVIORAL HEALTH ROUNDING  Patient sleeping: No.  Patient alert and oriented: yes  Behavior appropriate: Yes. ; If no, describe:  Nutrition and fluids offered: Yes  Toileting and hygiene offered: Yes  Sitter present: 1:1 sitter at bedside Q 15 min safety rounds and observation.  Law enforcement present: Yes ODS  Pt awaiting transfer to BHU.

## 2017-03-03 NOTE — ED Notes (Signed)
IVC/SOC completed/Pending Disposition 

## 2017-03-03 NOTE — ED Notes (Signed)
Urine sent at this time.

## 2017-03-03 NOTE — ED Notes (Signed)
The EKG was completed and signed by Dr. Sung. The EKG was also exported into the system. 

## 2017-03-03 NOTE — ED Notes (Signed)
Pt admits to doing cocaine around 300 pm on 03/02/16, alcohol and marijuana.

## 2017-03-03 NOTE — ED Notes (Signed)
BEHAVIORAL HEALTH ROUNDING  Patient sleeping: No.  Patient alert and oriented: yes  Behavior appropriate: Yes. ; If no, describe:  Nutrition and fluids offered: Yes  Toileting and hygiene offered: Yes  Sitter present: not applicable, Q 15 min safety rounds and observation.  Law enforcement present: Yes ODS  

## 2017-03-03 NOTE — ED Notes (Signed)
This RN received call from Pt mother inquiring on states. This RN informed mother that she needs to call back later tonight to find out visitation details as well as call times for the pt.

## 2017-03-03 NOTE — ED Notes (Signed)
This RN called BHU talked to Amy RN will call back once reviewed chart. RN will montior

## 2017-03-03 NOTE — ED Triage Notes (Signed)
Patient to [facility] via EMS from home for overdose.  Per EMS patient took a handful of her mothers Metformin 500mg  tabs (unknown amount).  Patient very tearful, saying "I wasn't trying to kill myself".  Patient with old superficial cuts to left wrist area.

## 2017-03-03 NOTE — ED Notes (Signed)
ED BHU PLACEMENT JUSTIFICATION Is the patient under IVC or is there intent for IVC: Yes.   Is the patient medically cleared: Yes.   Is there vacancy in the ED BHU: Yes.   Is the population mix appropriate for patient: Yes.   Is the patient awaiting placement in inpatient or outpatient setting: Yes.   Has the patient had a psychiatric consult: Yes.   Survey of unit performed for contraband, proper placement and condition of furniture, tampering with fixtures in bathroom, shower, and each patient room: Yes.   APPEARANCE/BEHAVIOR calm and adequate rapport can be established NEURO ASSESSMENT Orientation: time, place and person Hallucinations: No.None noted (Hallucinations) Speech: Normal Gait: normal RESPIRATORY ASSESSMENT Normal expansion.  Clear to auscultation.  No rales, rhonchi, or wheezing. CARDIOVASCULAR ASSESSMENT regular rate and rhythm, S1, S2 normal, no murmur, click, rub or gallop GASTROINTESTINAL ASSESSMENT soft, nontender, BS WNL, no r/g EXTREMITIES normal strength, tone, and muscle mass PLAN OF CARE Provide calm/safe environment. Vital signs assessed twice daily. ED BHU Assessment once each 12-hour shift. Collaborate with intake RN daily or as condition indicates. Assure the ED provider has rounded once each shift. Provide and encourage hygiene. Provide redirection as needed. Assess for escalating behavior; address immediately and inform ED provider.  Assess family dynamic and appropriateness for visitation as needed: Yes.   Educate the patient/family about BHU procedures/visitation: Yes.   

## 2017-03-03 NOTE — ED Notes (Signed)
This RN received call from a female whom states she is pt mother. This RN informed caller no information could be given over phone, and that at 1 pm she could call back to set up a proper password for information to be given over phone.

## 2017-03-03 NOTE — ED Notes (Signed)
Patient received PM snack. 

## 2017-03-03 NOTE — ED Notes (Signed)
This RN called for dinner tray. Pt is aware.

## 2017-03-04 DIAGNOSIS — F331 Major depressive disorder, recurrent, moderate: Secondary | ICD-10-CM

## 2017-03-04 DIAGNOSIS — F1010 Alcohol abuse, uncomplicated: Secondary | ICD-10-CM

## 2017-03-04 DIAGNOSIS — T50901A Poisoning by unspecified drugs, medicaments and biological substances, accidental (unintentional), initial encounter: Secondary | ICD-10-CM

## 2017-03-04 DIAGNOSIS — F1410 Cocaine abuse, uncomplicated: Secondary | ICD-10-CM

## 2017-03-04 LAB — GLUCOSE, CAPILLARY
Glucose-Capillary: 114 mg/dL — ABNORMAL HIGH (ref 65–99)
Glucose-Capillary: 84 mg/dL (ref 65–99)
Glucose-Capillary: 88 mg/dL (ref 65–99)

## 2017-03-04 NOTE — ED Provider Notes (Signed)
-----------------------------------------   12:42 PM on 03/04/2017 -----------------------------------------  Patient has been seen and evaluated by psychiatry, they believe the patient is safe for discharge home from a psychiatric standpoint.  Patient will follow-up with [facility].  Patient's medical workup has been largely nonrevealing besides an elevated alcohol level upon arrival.  I believe the patient is safe for discharge home at this time and will follow-up with [facility] as an outpatient.   [name], [name], MD 03/04/17 1242

## 2017-03-04 NOTE — Discharge Instructions (Signed)
You have been seen in the emergency department for a  psychiatric concern. You have been evaluated both medically as well as psychiatrically. Please follow-up with your outpatient resources provided. Return to the emergency department for any worsening symptoms, or any thoughts of hurting yourself or anyone else so that we may attempt to help you. 

## 2017-03-04 NOTE — Consult Note (Signed)
[facility] Consult   Reason for Consult: Consult for 22 year old woman who came to the emergency room after taking an overdose of her mother's prescription medicine Referring Physician: [facility] Patient Identification: Alicia Costa MRN:  [id number] Principal Diagnosis: Moderate recurrent major depression ([place]) Diagnosis:   Patient Active Problem List   Diagnosis Date Noted  . Overdose [T50.901A] 03/04/2017  . Moderate recurrent major depression ([place]) [F33.1] 03/04/2017  . Alcohol abuse [F10.10] 03/04/2017  . Cocaine abuse ([place]) [F14.10] 03/04/2017  . Hemorrhoids [K64.9]   . Indication for care or intervention related to labor and delivery [O75.9] 11/22/2014  . False labor after 37 completed weeks of gestation [O47.1] 11/20/2014  . Pelvic pain affecting pregnancy [O26.899, R10.2] 10/18/2014  . Indication for care in labor and delivery, antepartum [O75.9] 10/18/2014  . Pregnancy [Z34.90] 07/28/2014  . Pyelonephritis [N12] 07/28/2014  . Pyelonephritis affecting pregnancy in second trimester, antepartum [O23.02] 07/23/2014    Total Time spent with patient: 1 hour  Subjective:   Alicia Costa is a 22 y.o. female patient admitted with "I took some pills".  HPI: Patient interviewed chart reviewed.  22 year old woman came to the emergency room last night with reports that she had taken an overdose of her mother's prescription medicine.  She thinks it was blood pressure medicine.  She was angry at her mother and she and her mother had been having an argument at that time that had been going on much of the day.  Patient immediately told her family what she had done and was cooperative with treatment.  She denies that she had any thought that she actually wanted to kill herself but admits that her mood does stay down and negative much of the time.  Yesterday however she had been drinking and had an elevated blood alcohol level when she came in.  She also admits that  she is intermittently abusing other drugs including cocaine and marijuana.  Patient feels tired much of the time.  Sleeps a lot.  Appetite is off.  Feels negative much of the time.  Denies any psychotic symptoms denies any actual suicidal thoughts.  Current symptoms of been present for about 3 or 4 months.  Trying to take care of her children still does provide positive things in her life.  She is not getting any outpatient mental health treatment.  Social history: Patient lost her most recent job back in the summer after attending her ex-boyfriend's funeral.  She lives with her mother.  She has 3 young children at home.  Not working outside the home.  Medical history: No significant ongoing medical problems.  Seems to have suffered no major medical problems from this overdose.  Substance abuse history: Patient is a little evasive but admits that she abuses cocaine and marijuana intermittently.  Place down the cocaine saying she only does it very rarely although admits that marijuana is more frequent.  Had been drinking yesterday but claims she only drinks about once a month.  Has never been in any kind of substance abuse treatment.  Past Psychiatric History: She says her only past psychiatric treatment was when she was an adolescent.  She was referred to see a psychiatrist for mood and behavior problems including cutting but never followed up with it.  It looks like from the chart she was diagnosed with postpartum depression at one point as well.  No hospitalizations.  No past suicide attempts.  Risk to Self: Suicidal Ideation: No Suicidal Intent: No Is patient at risk  for suicide?: No Suicidal Plan?: No Access to Means: Yes Specify Access to Suicidal Means: medications What has been your use of drugs/alcohol within the last 12 months?: Alcohol How many times?: 0 Other Self Harm Risks: self injurious behaviors Triggers for Past Attempts: Family contact("fights with mother") Intentional Self  Injurious Behavior: Cutting Comment - Self Injurious Behavior: cutting wrists Risk to Others: Homicidal Ideation: No Thoughts of Harm to Others: No Current Homicidal Intent: No Current Homicidal Plan: No Access to Homicidal Means: No Identified Victim: N/A History of harm to others?: No Assessment of Violence: None Noted Violent Behavior Description: N/A Does patient have access to weapons?: No Criminal Charges Pending?: No Does patient have a court date: No Prior Inpatient Therapy: Prior Inpatient Therapy: No Prior Therapy Dates: N/A Prior Therapy Facilty/Provider(s): N/A Reason for Treatment: N/A Prior Outpatient Therapy: Prior Outpatient Therapy: No Prior Therapy Dates: N/A Prior Therapy Facilty/Provider(s): N/A Reason for Treatment: N/A Does patient have an ACCT team?: No Does patient have Intensive In-House Services?  : No Does patient have [facility] services? : No Does patient have P4CC services?: No  Past Medical History: History reviewed. No pertinent past medical history.  Past Surgical History:  Procedure Laterality Date  . EVALUATION UNDER ANESTHESIA WITH HEMORRHOIDECTOMY N/A 06/24/2016   Procedure: EXAM UNDER ANESTHESIA WITH HEMORRHOIDECTOMY;  Surgeon: Jules Husbands, MD;  Location: [facility] ORS;  Service: General;  Laterality: N/A;   Family History:  Family History  Problem Relation Age of Onset  . Cancer Maternal Grandmother   . Cancer Maternal Grandfather   . Hypertension Paternal Grandmother   . Diabetes Paternal Grandmother    Family Psychiatric  History: Positive for substance abuse and mental health problems Social History:  Social History   Substance and Sexual Activity  Alcohol Use Yes   Comment: everyweekend     Social History   Substance and Sexual Activity  Drug Use Yes  . Frequency: 7.0 times per week  . Types: Marijuana    Social History   Socioeconomic History  . Marital status: Single    Spouse name: None  . Number of children: None  .  Years of education: None  . Highest education level: None  Social Needs  . Financial resource strain: None  . Food insecurity - worry: None  . Food insecurity - inability: None  . Transportation needs - medical: None  . Transportation needs - non-medical: None  Occupational History  . None  Tobacco Use  . Smoking status: Current Every Day Smoker    Packs/day: 0.25    Types: Cigarettes  . Smokeless tobacco: Never Used  Substance and Sexual Activity  . Alcohol use: Yes    Comment: everyweekend  . Drug use: Yes    Frequency: 7.0 times per week    Types: Marijuana  . Sexual activity: Yes    Birth control/protection: IUD  Other Topics Concern  . None  Social History Narrative  . None   Additional Social History:    Allergies:   Allergies  Allergen Reactions  . Sulfa Antibiotics     Pt states she is not sure what her allergic reaction was. atates something about having a seizure but just keeps saying, "I don't know."     Labs:  Results for orders placed or performed during the hospital encounter of 03/03/17 (from the past 48 hour(s))  Comprehensive metabolic panel     Status: Abnormal   Collection Time: 03/03/17  7:17 AM  Result Value Ref Range  Sodium 143 135 - 145 mmol/L   Potassium 4.0 3.5 - 5.1 mmol/L   Chloride 109 101 - 111 mmol/L   CO2 24 22 - 32 mmol/L   Glucose, Bld 109 (H) 65 - 99 mg/dL   BUN 8 6 - 20 mg/dL   Creatinine, Ser 0.67 0.44 - 1.00 mg/dL   Calcium 8.7 (L) 8.9 - 10.3 mg/dL   Total Protein 8.2 (H) 6.5 - 8.1 g/dL   Albumin 4.1 3.5 - 5.0 g/dL   AST 46 (H) 15 - 41 U/L   ALT 31 14 - 54 U/L   Alkaline Phosphatase 94 38 - 126 U/L   Total Bilirubin 0.4 0.3 - 1.2 mg/dL   GFR calc non Af Amer >60 >60 mL/min   GFR calc Af Amer >60 >60 mL/min    Comment: (NOTE) The eGFR has been calculated using the CKD EPI equation. This calculation has not been validated in all clinical situations. eGFR's persistently <60 mL/min signify possible Chronic  Kidney Disease.    Anion gap 10 5 - 15    Comment: Performed at [facility], [address] [place], [place] [address]  Ethanol     Status: Abnormal   Collection Time: 03/03/17  7:17 AM  Result Value Ref Range   Alcohol, Ethyl (B) 157 (H) <10 mg/dL    Comment:        LOWEST DETECTABLE LIMIT FOR SERUM ALCOHOL IS 10 mg/dL FOR MEDICAL PURPOSES ONLY Performed at [facility], [address] [place], [place] [address]   Salicylate level     Status: None   Collection Time: 03/03/17  7:17 AM  Result Value Ref Range   Salicylate Lvl <8.4 2.8 - 30.0 mg/dL    Comment: Performed at [facility], [address] [place], [place] [address]  Acetaminophen level     Status: Abnormal   Collection Time: 03/03/17  7:17 AM  Result Value Ref Range   Acetaminophen (Tylenol), Serum <10 (L) 10 - 30 ug/mL    Comment:        THERAPEUTIC CONCENTRATIONS VARY SIGNIFICANTLY. A RANGE OF 10-30 ug/mL MAY BE AN EFFECTIVE CONCENTRATION FOR MANY PATIENTS. HOWEVER, SOME ARE BEST TREATED AT CONCENTRATIONS OUTSIDE THIS RANGE. ACETAMINOPHEN CONCENTRATIONS >150 ug/mL AT 4 HOURS AFTER INGESTION AND >50 ug/mL AT 12 HOURS AFTER INGESTION ARE OFTEN ASSOCIATED WITH TOXIC REACTIONS. Performed at [facility], [address] [place], [place] [address]   cbc     Status: Abnormal   Collection Time: 03/03/17  7:17 AM  Result Value Ref Range   WBC 7.5 3.6 - 11.0 K/uL   RBC 4.41 3.80 - 5.20 MIL/uL   Hemoglobin 10.3 (L) 12.0 - 16.0 g/dL   HCT 32.9 (L) 35.0 - 47.0 %   MCV 74.5 (L) 80.0 - 100.0 fL   MCH 23.4 (L) 26.0 - 34.0 pg   MCHC 31.5 (L) 32.0 - 36.0 g/dL   RDW 20.5 (H) 11.5 - 14.5 %   Platelets 266 150 - 440 K/uL    Comment: Performed at [facility], [address] [place], [place] [address]  Glucose, capillary     Status: Abnormal   Collection Time: 03/03/17  7:25 AM  Result Value Ref Range   Glucose-Capillary 105 (H) 65 - 99 mg/dL  Glucose, capillary      Status: None   Collection Time: 03/03/17  8:27 AM  Result Value Ref Range   Glucose-Capillary 94 65 - 99 mg/dL  Urine Drug Screen,  Qualitative ([place] only)     Status: Abnormal   Collection Time: 03/03/17  9:03 AM  Result Value Ref Range   Tricyclic, Ur Screen NONE DETECTED NONE DETECTED   Amphetamines, Ur Screen NONE DETECTED NONE DETECTED   MDMA (Ecstasy)Ur Screen NONE DETECTED NONE DETECTED   Cocaine Metabolite,Ur [place] POSITIVE (A) NONE DETECTED   Opiate, Ur Screen NONE DETECTED NONE DETECTED   Phencyclidine (PCP) Ur S NONE DETECTED NONE DETECTED   Cannabinoid 50 Ng, Ur [place] POSITIVE (A) NONE DETECTED   Barbiturates, Ur Screen NONE DETECTED NONE DETECTED   Benzodiazepine, Ur Scrn NONE DETECTED NONE DETECTED   Methadone Scn, Ur NONE DETECTED NONE DETECTED    Comment: (NOTE) Tricyclics + metabolites, urine    Cutoff 1000 ng/mL Amphetamines + metabolites, urine  Cutoff 1000 ng/mL MDMA (Ecstasy), urine              Cutoff 500 ng/mL Cocaine Metabolite, urine          Cutoff 300 ng/mL Opiate + metabolites, urine        Cutoff 300 ng/mL Phencyclidine (PCP), urine         Cutoff 25 ng/mL Cannabinoid, urine                 Cutoff 50 ng/mL Barbiturates + metabolites, urine  Cutoff 200 ng/mL Benzodiazepine, urine              Cutoff 200 ng/mL Methadone, urine                   Cutoff 300 ng/mL The urine drug screen provides only a preliminary, unconfirmed analytical test result and should not be used for non-medical purposes. Clinical consideration and professional judgment should be applied to any positive drug screen result due to possible interfering substances. A more specific alternate chemical method must be used in order to obtain a confirmed analytical result. Gas chromatography / mass spectrometry (GC/MS) is the preferred confirmat ory method. Performed at [facility], [address] [place], [place] [address]   Pregnancy, urine POC     Status: None   Collection  Time: 03/03/17  9:07 AM  Result Value Ref Range   Preg Test, Ur NEGATIVE NEGATIVE    Comment:        THE SENSITIVITY OF THIS METHODOLOGY IS >24 mIU/mL   Glucose, capillary     Status: None   Collection Time: 03/03/17 10:07 AM  Result Value Ref Range   Glucose-Capillary 83 65 - 99 mg/dL  Glucose, capillary     Status: None   Collection Time: 03/03/17 11:15 AM  Result Value Ref Range   Glucose-Capillary 76 65 - 99 mg/dL  Glucose, capillary     Status: None   Collection Time: 03/03/17 12:14 PM  Result Value Ref Range   Glucose-Capillary 77 65 - 99 mg/dL  Glucose, capillary     Status: None   Collection Time: 03/03/17  2:23 PM  Result Value Ref Range   Glucose-Capillary 77 65 - 99 mg/dL  Glucose, capillary     Status: None   Collection Time: 03/03/17  3:09 PM  Result Value Ref Range   Glucose-Capillary 86 65 - 99 mg/dL  Glucose, capillary     Status: None   Collection Time: 03/03/17  4:07 PM  Result Value Ref Range   Glucose-Capillary 76 65 - 99 mg/dL   Comment 1 Notify RN   Glucose, capillary     Status: None   Collection Time: 03/03/17  5:04 PM  Result Value Ref Range   Glucose-Capillary 93 65 - 99 mg/dL   Comment 1 Notify RN   Glucose, capillary     Status: None   Collection Time: 03/03/17  5:59 PM  Result Value Ref Range   Glucose-Capillary 91 65 - 99 mg/dL   Comment 1 Notify RN   Glucose, capillary     Status: None   Collection Time: 03/03/17  7:10 PM  Result Value Ref Range   Glucose-Capillary 70 65 - 99 mg/dL  Glucose, capillary     Status: Abnormal   Collection Time: 03/03/17  8:17 PM  Result Value Ref Range   Glucose-Capillary 115 (H) 65 - 99 mg/dL  Glucose, capillary     Status: Abnormal   Collection Time: 03/04/17  3:54 AM  Result Value Ref Range   Glucose-Capillary 114 (H) 65 - 99 mg/dL  Glucose, capillary     Status: None   Collection Time: 03/04/17  8:20 AM  Result Value Ref Range   Glucose-Capillary 84 65 - 99 mg/dL  Glucose, capillary     Status:  None   Collection Time: 03/04/17 12:26 PM  Result Value Ref Range   Glucose-Capillary 88 65 - 99 mg/dL    Current Facility-Administered Medications  Medication Dose Route Frequency Provider Last Rate Last Dose  . FLUoxetine (PROZAC) capsule 20 mg  20 mg Oral QHS Merlyn Lot, MD   20 mg at 03/03/17 2159  . LORazepam (ATIVAN) tablet 2 mg  2 mg Oral Q6H PRN Merlyn Lot, MD      . OLANZapine [facility]) tablet 5 mg  5 mg Oral QHS Merlyn Lot, MD   5 mg at 03/03/17 2200   Current Outpatient Medications  Medication Sig Dispense Refill  . docusate sodium (COLACE) 100 MG capsule Take 100 mg by mouth daily as needed for mild constipation.    . naproxen (NAPROSYN) 500 MG tablet Take 1 tablet (500 mg total) by mouth 2 (two) times daily with a meal. (Patient not taking: Reported on 03/03/2017) 20 tablet 00  . oxyCODONE-acetaminophen (PERCOCET) 10-325 MG tablet Take 1 tablet by mouth every 4 (four) hours as needed for pain (may take 2 for severe pain). (Patient not taking: Reported on 03/03/2017) 40 tablet 0  . sertraline (ZOLOFT) 25 MG tablet Take 25 mg by mouth daily.      Musculoskeletal: Strength & Muscle Tone: within normal limits Gait & Station: normal Patient leans: N/A  Psychiatric Specialty Exam: Physical Exam  Nursing note and vitals reviewed. Constitutional: She appears well-developed and well-nourished.  HENT:  Head: Normocephalic and atraumatic.  Eyes: Conjunctivae are normal. Pupils are equal, round, and reactive to light.  Neck: Normal range of motion.  Cardiovascular: Regular rhythm and normal heart sounds.  Respiratory: Effort normal. No respiratory distress.  GI: Soft.  Musculoskeletal: Normal range of motion.  Neurological: She is alert.  Skin: Skin is warm and dry.  Psychiatric: Judgment normal. Her speech is delayed. She is slowed. Thought content is not paranoid. Cognition and memory are impaired. She exhibits a depressed mood. She expresses no homicidal  and no suicidal ideation.    Review of Systems  Constitutional: Negative.   HENT: Negative.   Eyes: Negative.   Respiratory: Negative.   Cardiovascular: Negative.   Gastrointestinal: Negative.   Musculoskeletal: Negative.   Skin: Negative.   Neurological: Negative.   Psychiatric/Behavioral: Positive for depression and substance abuse. Negative for hallucinations, memory loss and suicidal ideas. The patient is not nervous/anxious and  does not have insomnia.     Blood pressure (!) 104/54, pulse 65, temperature 98.4 F (36.9 C), temperature source Oral, resp. rate 18, height _0  (1.753 m), weight 77.1 kg (170 lb), last menstrual period 02/26/2017, SpO2 99 %, not currently breastfeeding.Body mass index is 25.1 kg/m.  General Appearance: Casual  Eye Contact:  Good  Speech:  Slow  Volume:  Decreased  Mood:  Euthymic  Affect:  Constricted  Thought Process:  Goal Directed  Orientation:  Full (Time, Place, and Person)  Thought Content:  Logical  Suicidal Thoughts:  No  Homicidal Thoughts:  No  Memory:  Immediate;   Good Recent;   Good Remote;   Fair  Judgement:  Fair  Insight:  Fair  Psychomotor Activity:  Normal  Concentration:  Concentration: Fair  Recall:  [employer] of Knowledge:  Fair  Language:  Fair  Akathisia:  No  Handed:  Right  AIMS (if indicated):     Assets:  Desire for Improvement Housing Physical Health Resilience Social Support  ADL's:  Intact  Cognition:  WNL  Sleep:        Treatment Plan Summary: Plan 22 year old woman who took an overdose of pills belonging to her mother but it does not appear she had real suicidal intent and she is consistently denied suicidal ideation.  She does have depressive symptoms but also has ongoing substance abuse issues.  I have given her a diagnosis of recurrent major depression although I think it is possible that the substance abuse could be the more primary issue.  No indication at this point to start new medication but  I have impressed upon her that she really needs to follow-up with outpatient treatment and stop using alcohol and drugs.  Discontinue IVC.  Case reviewed with emergency room physician and TTS.  She should be referred to [place].  Disposition: Recommend psychiatric Inpatient admission when medically cleared. Supportive therapy provided about ongoing stressors.  Alethia Berthold, MD 03/04/2017 2:15 PM

## 2017-03-04 NOTE — ED Notes (Signed)
Ivc/ per soc orders admit to inpatient psychiatry service

## 2017-03-04 NOTE — BH Assessment (Signed)
Assessment Note  Alicia Costa is an 22 y.o. female.  Patient presented to [facility] due to depression and overdosing on mother's medication. Patient stated she and her mother got into an argument and she took 10 pills. In addition, patient reports taking the pills wasn't a suicide attempt, she just wanted to go to sleep. She stated her mother found her in her room and called 911. Patient endorsed a history of self-injurious behaviors characterized by cutting her wrists. Patient reported the last time she cut her wrists was a couple of months ago. Patient endorses SI at admission to the ED, however currently denies SI/HI and AVH. Patient stated she receives medication management for depression from her primary care doctor. Patient was cooperative, with a flat affect, and presented disheveled during assessment.  Attempts were made to contact patient's mother, April Summers at [phone] and [phone], however was unsuccessful.   Diagnosis: Depression  Past Medical History: History reviewed. No pertinent past medical history.  Past Surgical History:  Procedure Laterality Date  . EVALUATION UNDER ANESTHESIA WITH HEMORRHOIDECTOMY N/A 06/24/2016   Procedure: EXAM UNDER ANESTHESIA WITH HEMORRHOIDECTOMY;  Surgeon: Leafy Ro, MD;  Location: [facility] ORS;  Service: General;  Laterality: N/A;    Family History:  Family History  Problem Relation Age of Onset  . Cancer Maternal Grandmother   . Cancer Maternal Grandfather   . Hypertension Paternal Grandmother   . Diabetes Paternal Grandmother     Social History:  reports that she has been smoking cigarettes.  She has been smoking about 0.25 packs per day. she has never used smokeless tobacco. She reports that she drinks alcohol. She reports that she uses drugs. Drug: Marijuana. Frequency: 7.00 times per week.  Additional Social History:  Alcohol / Drug Use Pain Medications: SEE PTA  Prescriptions: SEE PTA  Over the Counter: SEE PTA  History  of alcohol / drug use?: Yes Longest period of sobriety (when/how long): "a few weeks" Negative Consequences of Use: Personal relationships Substance #1 Name of Substance 1: Alcohol  1 - Age of First Use: 22 years old  1 - Amount (size/oz): "a couple of shots"  1 - Frequency: "socially" 1 - Duration: 2 years  1 - Last Use / Amount: 03/02/2017/"a couple of shots"  CIWA: CIWA-Ar BP: (!) 104/54 Pulse Rate: 65 COWS:    Allergies:  Allergies  Allergen Reactions  . Sulfa Antibiotics     Pt states she is not sure what her allergic reaction was. atates something about having a seizure but just keeps saying, "I don't know."     Home Medications:  (Not in a hospital admission)  OB/GYN Status:  Patient's last menstrual period was 02/26/2017.  General Assessment Data Assessment unable to be completed: (Assessment Completed ) Location of Assessment: [facility] TTS Assessment: In system Is this a Tele or Face-to-Face Assessment?: Face-to-Face Is this an Initial Assessment or a Re-assessment for this encounter?: Initial Assessment Marital status: Single Maiden name: N/A Is patient pregnant?: No Pregnancy Status: No Living Arrangements: Parent Can pt return to current living arrangement?: Yes Admission Status: Involuntary Is patient capable of signing voluntary admission?: Yes Referral Source: Self/Family/Friend Insurance type: None  Medical Screening Exam [facility] Walk-in ONLY) Medical Exam completed: Yes  Crisis Care Plan Living Arrangements: Parent Legal Guardian: Other:(N/A) Name of Psychiatrist: None reported  Name of Therapist: None reported   Education Status Is patient currently in school?: No Current Grade: N/A Highest grade of school patient has completed: 9th Name of school:  N/A Contact person: N/A  Risk to self with the past 6 months Suicidal Ideation: No Has patient been a risk to self within the past 6 months prior to admission? : Yes Suicidal Intent: No Has  patient had any suicidal intent within the past 6 months prior to admission? : Yes Is patient at risk for suicide?: No Suicidal Plan?: No Has patient had any suicidal plan within the past 6 months prior to admission? : Yes Access to Means: Yes Specify Access to Suicidal Means: medications What has been your use of drugs/alcohol within the last 12 months?: Alcohol Previous Attempts/Gestures: No How many times?: 0 Other Self Harm Risks: self injurious behaviors Triggers for Past Attempts: Family contact("fights with mother") Intentional Self Injurious Behavior: Cutting Comment - Self Injurious Behavior: cutting wrists Family Suicide History: No Recent stressful life event(s): Conflict (Comment)(mother) Persecutory voices/beliefs?: No Depression: Yes Depression Symptoms: Isolating, Tearfulness, Feeling angry/irritable Substance abuse history and/or treatment for substance abuse?: No Suicide prevention information given to non-admitted patients: Not applicable  Risk to Others within the past 6 months Homicidal Ideation: No Does patient have any lifetime risk of violence toward others beyond the six months prior to admission? : No Thoughts of Harm to Others: No Current Homicidal Intent: No Current Homicidal Plan: No Access to Homicidal Means: No Identified Victim: N/A History of harm to others?: No Assessment of Violence: None Noted Violent Behavior Description: N/A Does patient have access to weapons?: No Criminal Charges Pending?: No Does patient have a court date: No Is patient on probation?: No  Psychosis Hallucinations: None noted Delusions: None noted  Mental Status Report Appearance/Hygiene: In hospital gown, Disheveled, Poor hygiene Eye Contact: Fair Motor Activity: Unremarkable Speech: Soft Level of Consciousness: Alert Mood: Depressed Affect: Depressed, Blunted Anxiety Level: None Thought Processes: Coherent Judgement: Impaired Orientation: Person, Place,  Time, Situation, Appropriate for developmental age Obsessive Compulsive Thoughts/Behaviors: None  Cognitive Functioning Concentration: Normal Memory: Recent Intact, Remote Intact IQ: Average Insight: Poor Impulse Control: Poor Appetite: Good Weight Loss: 0 Weight Gain: 0 Sleep: No Change Total Hours of Sleep: 6 Vegetative Symptoms: None  ADLScreening [facility] Assessment Services) Patient's cognitive ability adequate to safely complete daily activities?: Yes Patient able to express need for assistance with ADLs?: Yes Independently performs ADLs?: Yes (appropriate for developmental age)  Prior Inpatient Therapy Prior Inpatient Therapy: No Prior Therapy Dates: N/A Prior Therapy Facilty/Provider(s): N/A Reason for Treatment: N/A  Prior Outpatient Therapy Prior Outpatient Therapy: No Prior Therapy Dates: N/A Prior Therapy Facilty/Provider(s): N/A Reason for Treatment: N/A Does patient have an ACCT team?: No Does patient have Intensive In-House Services?  : No Does patient have [facility] services? : No Does patient have P4CC services?: No  ADL Screening (condition at time of admission) Patient's cognitive ability adequate to safely complete daily activities?: Yes Patient able to express need for assistance with ADLs?: Yes Independently performs ADLs?: Yes (appropriate for developmental age)       Abuse/Neglect Assessment (Assessment to be complete while patient is alone) Abuse/Neglect Assessment Can Be Completed: Yes Physical Abuse: Denies Verbal Abuse: Denies Sexual Abuse: Denies Exploitation of patient/patient's resources: Denies Self-Neglect: Denies Values / Beliefs Cultural Requests During Hospitalization: None Spiritual Requests During Hospitalization: None Consults Spiritual Care Consult Needed: No Social Work Consult Needed: No Merchant navy officerAdvance Directives (For Healthcare) Does Patient Have a Medical Advance Directive?: No Would patient like information on creating a  medical advance directive?: No - Patient declined    Additional Information 1:1 In Past 12 Months?: No CIRT Risk:  No Elopement Risk: No Does patient have medical clearance?: Yes     Disposition:  Disposition Initial Assessment Completed for this Encounter: Yes Disposition of Patient: Pending Review with psychiatrist  On Site Evaluation by:   Reviewed with Physician:    Lavender Stanke L Arion Morgan, LPCA, LCASA 03/04/2017 10:24 AM

## 2017-03-25 IMAGING — US US OB US >=[ID] SNGL FETUS
1 series · 13 of 28 positions shown · non-contrast
Comparison: none

CLINICAL DATA: Anatomic scan

EXAM:
ULTRASOUND OB >=D6KXX SINGLE FETUS

[Series 1: us ob us >=(id) sngl fetus · 0.26mm/px · 13 of 187 slices shown]
[im 7/187]
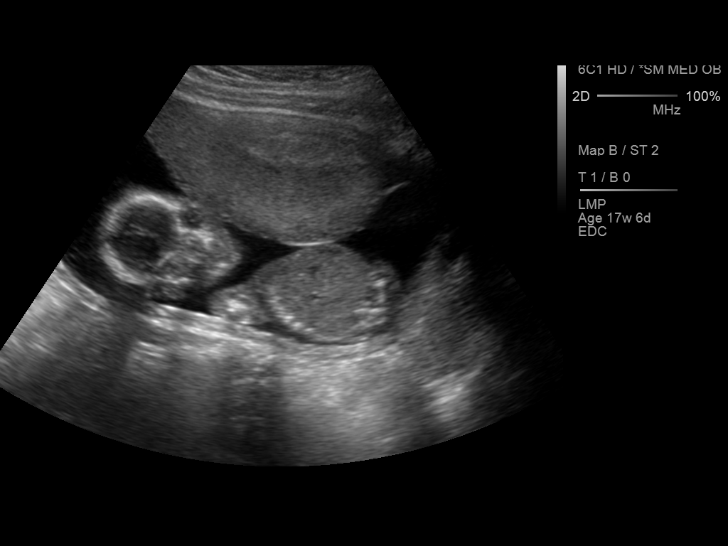
[im 21/187]
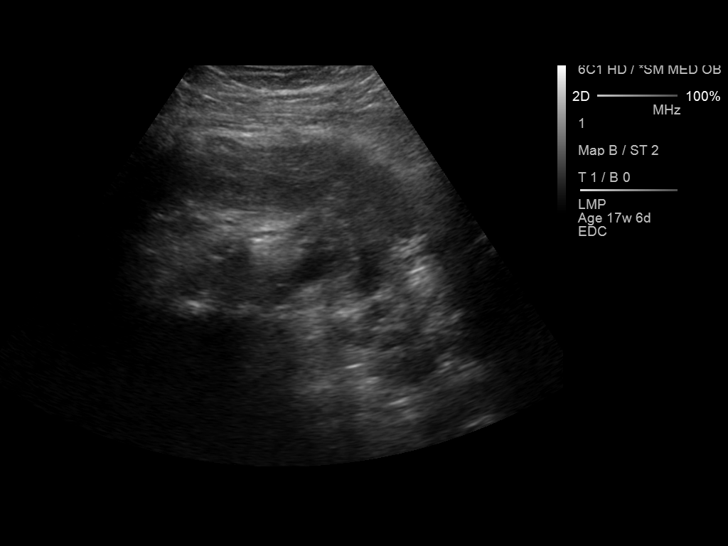
[im 35/187]
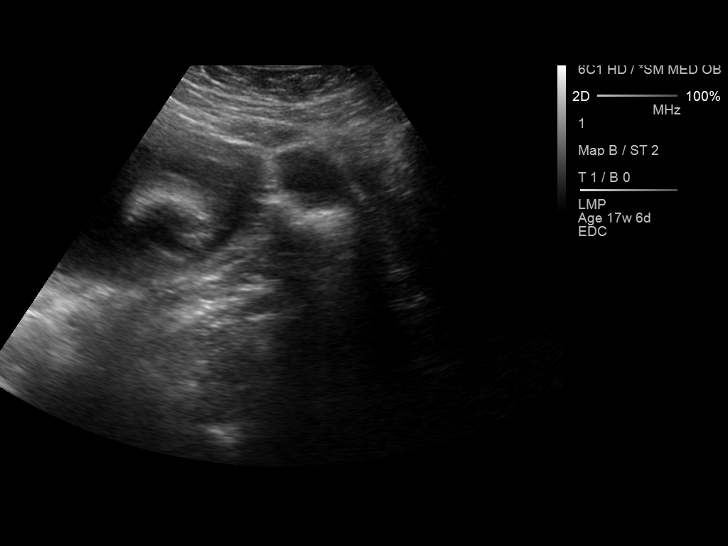
[im 49/187]
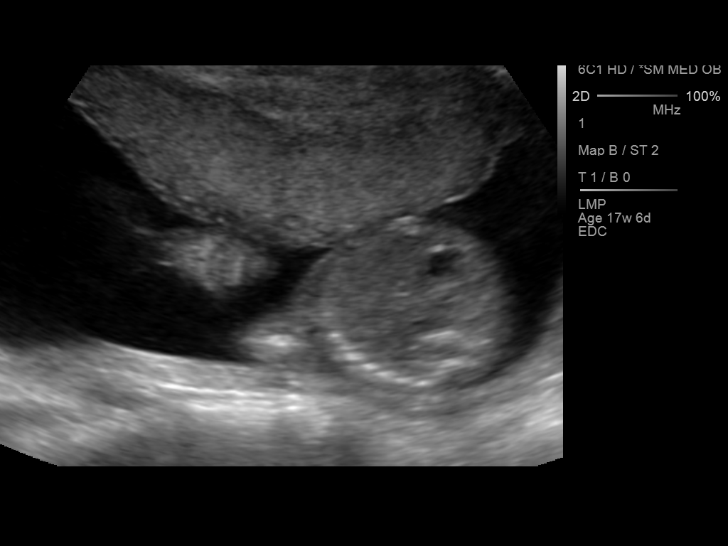
[im 63/187]
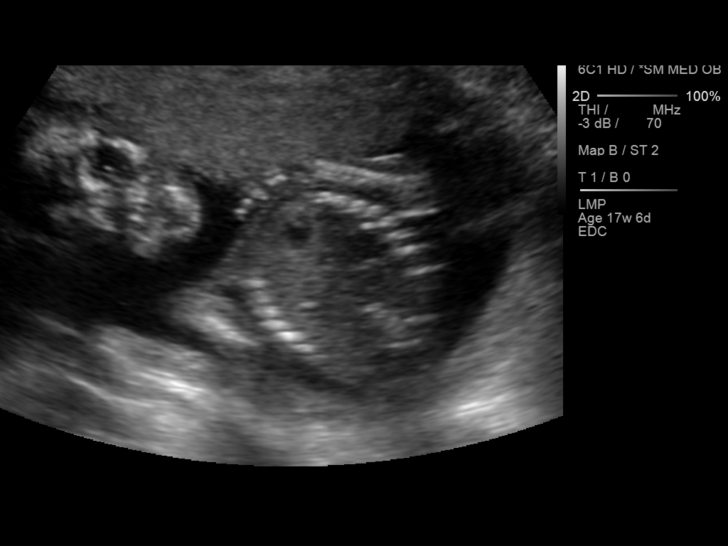
[im 76/187]
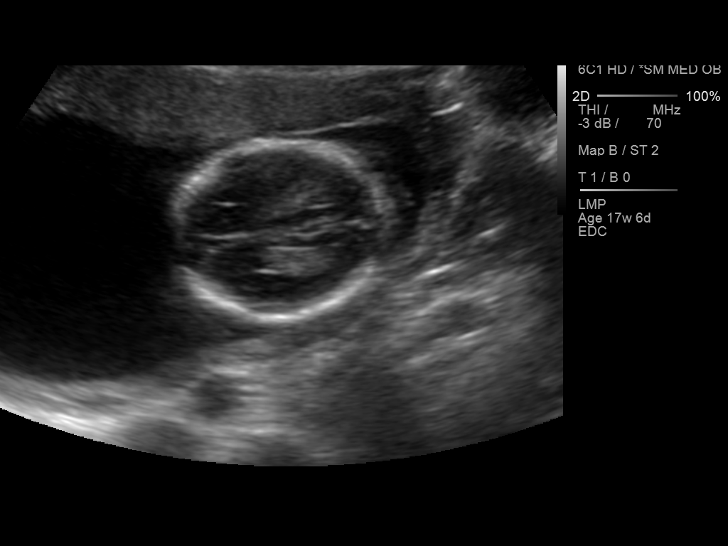
[im 97/187]
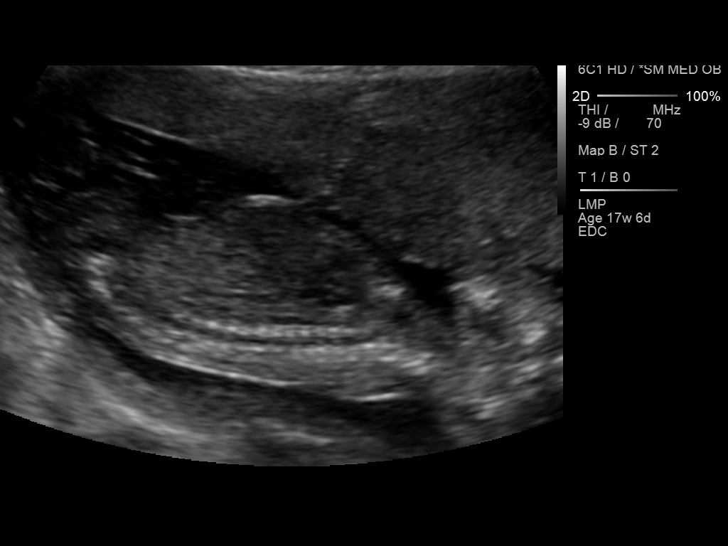
[im 111/187]
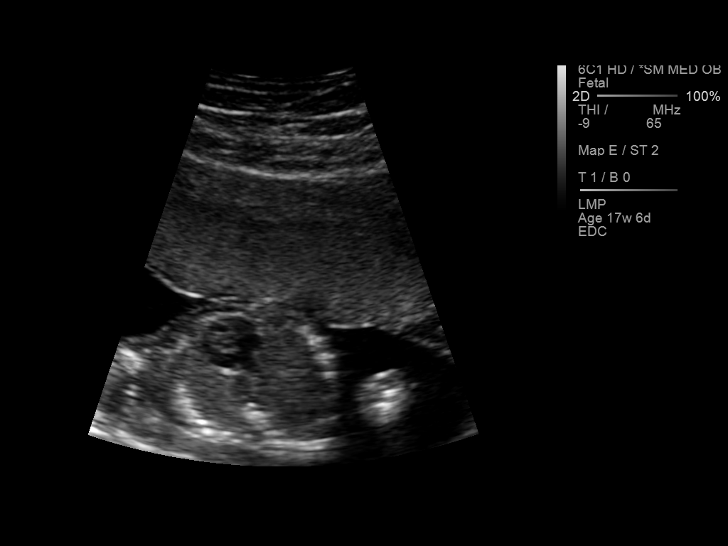
[im 125/187]
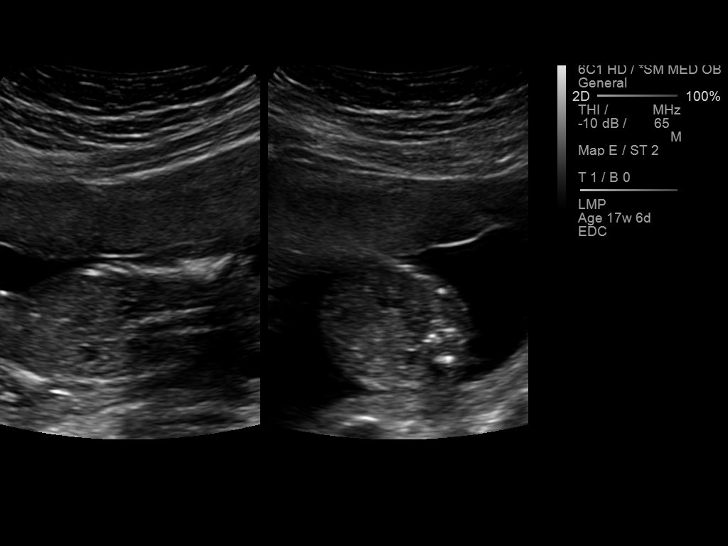
[im 138/187]
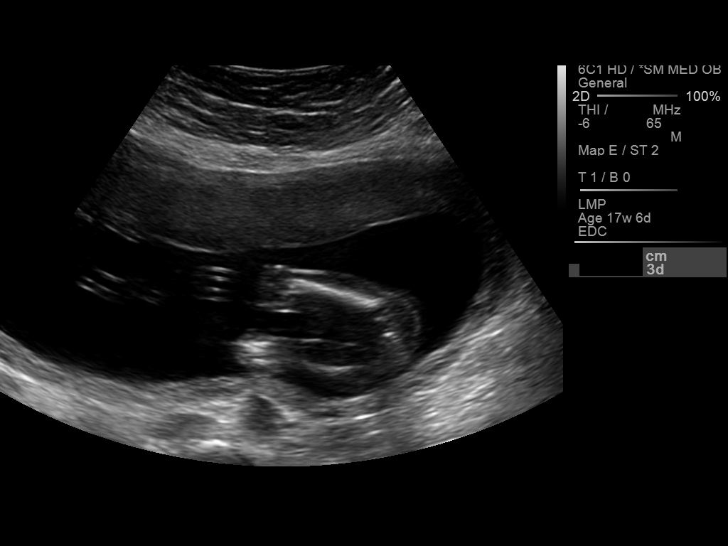
[im 152/187]
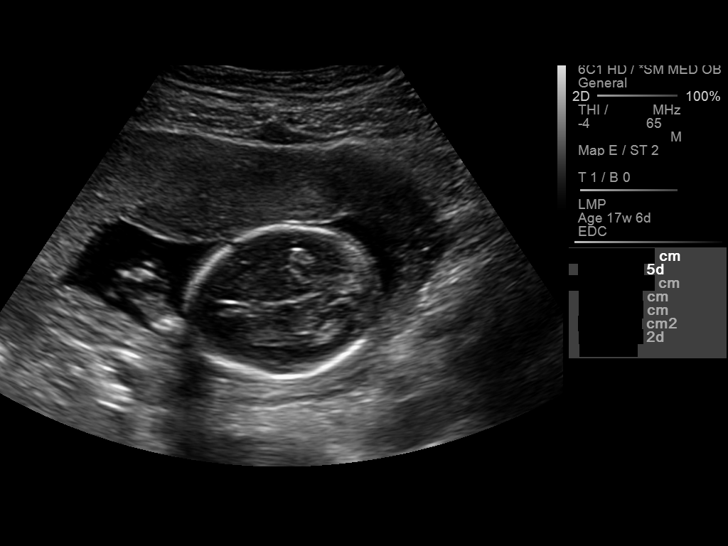
[im 166/187]
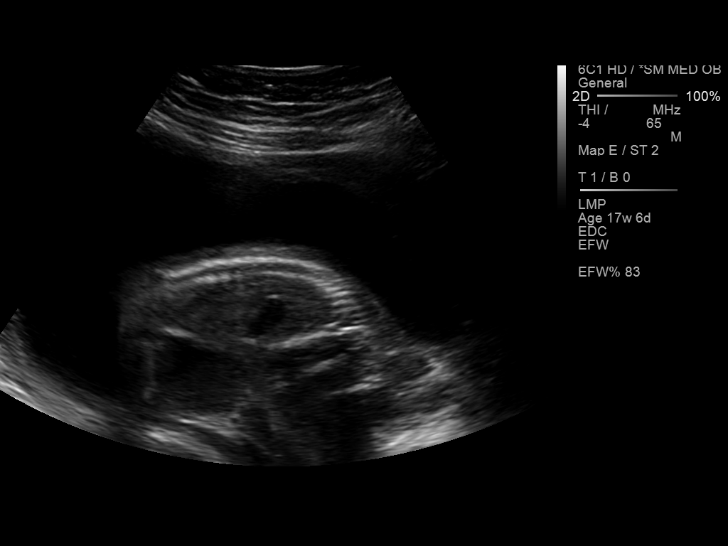
[im 180/187]
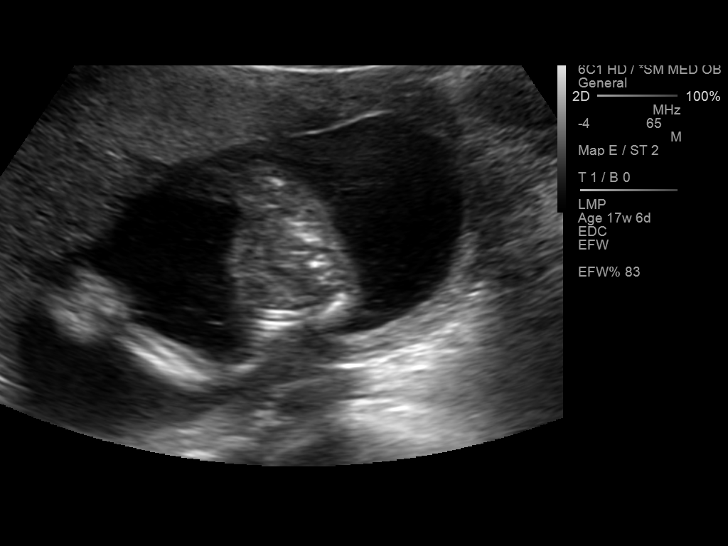

[13 of 28 positions shown; findings below may reference images not displayed]

FINDINGS: Number of Fetuses: 1

Heart Rate:  162 bpm

Movement: Present

Presentation: Variable

Previa: None

Placental Location: Anterior; the distance from the internal
cervical os to the lower placental segment is 2.56 cm.

Amniotic Fluid (Subjective): Normal

Vertical pocket 7cm

FETAL BIOMETRY

BPD:  4.2cm 18w 4d

HC:    15.26cm  18w   2d

AC:   13.03cm  18w   4d

FL:   2.77cm  18w   3d

Current Mean GA: 18w 3d              US EDC: November 13, 2014

Estimated Fetal Weight:  243g    83%ile

FETAL ANATOMY

Lateral Ventricles: Visualized

Thalami/CSP: Visualized

Posterior Fossa:  Visualized

Nuchal Region: Visualized    Upper Lip: Visualized

Spine: Visualized

4 Chamber Heart on Left: Visualized

LVOT: Visualized

RVOT: Visualized

Stomach on Left: Visualized

3 Vessel Cord: Visualized

Cord Insertion site: Visualized

Kidneys: Visualized

Bladder: Visualized

Extremities: Visualized

Maternal Findings:

Cervix:  The cervical length is 4.2 cm transabdominally.
IMPRESSION: There is a single viable IUP with estimated gestational age of 18
weeks 3 days with estimated date of confinement November 13, 2014. No fetal anomalies were observed. The amniotic fluid volume is
normal. The placenta is anterior without evidence of previa.
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
## 2017-12-19 LAB — HM HIV SCREENING LAB: HM HIV Screening: NEGATIVE

## 2018-11-10 ENCOUNTER — Encounter: Payer: Self-pay | Admitting: Emergency Medicine

## 2018-11-10 ENCOUNTER — Other Ambulatory Visit: Payer: Self-pay

## 2018-11-10 ENCOUNTER — Emergency Department
Admission: EM | Admit: 2018-11-10 | Discharge: 2018-11-10 | Disposition: A | Discharge status: Home / Self Care | Payer: Self-pay | Attending: Emergency Medicine | Admitting: Emergency Medicine

## 2018-11-10 DIAGNOSIS — Z79899 Other long term (current) drug therapy: Secondary | ICD-10-CM | POA: Insufficient documentation

## 2018-11-10 DIAGNOSIS — J020 Streptococcal pharyngitis: Secondary | ICD-10-CM | POA: Insufficient documentation

## 2018-11-10 DIAGNOSIS — F17210 Nicotine dependence, cigarettes, uncomplicated: Secondary | ICD-10-CM | POA: Insufficient documentation

## 2018-11-10 DIAGNOSIS — N3090 Cystitis, unspecified without hematuria: Secondary | ICD-10-CM | POA: Insufficient documentation

## 2018-11-10 LAB — URINALYSIS, COMPLETE (UACMP) WITH MICROSCOPIC
Bacteria, UA: NONE SEEN
Bilirubin Urine: NEGATIVE
Glucose, UA: NEGATIVE mg/dL
Hgb urine dipstick: NEGATIVE
Ketones, ur: NEGATIVE mg/dL
Nitrite: NEGATIVE
Protein, ur: NEGATIVE mg/dL
Specific Gravity, Urine: 1.019 (ref 1.005–1.030)
pH: 8 (ref 5.0–8.0)

## 2018-11-10 LAB — GROUP A STREP BY PCR: Group A Strep by PCR: DETECTED — AB

## 2018-11-10 MED ORDER — ACETAMINOPHEN 500 MG PO TABS
1000.0000 mg | ORAL_TABLET | Freq: Once | ORAL | Status: AC
  Administered 2018-11-10: 1000 mg via ORAL
  Filled 2018-11-10: qty 2

## 2018-11-10 MED ORDER — CEPHALEXIN 500 MG PO CAPS: 500.0000 mg | ORAL_CAPSULE | Freq: Three times a day (TID) | ORAL | 0 refills | Status: DC

## 2018-11-10 NOTE — Discharge Instructions (Addendum)
Follow-up with your primary care provider if any continued problems.  You will need to take the entire 10-day course of Keflex to completely treat the strep pharyngitis that you have.  Increase fluids.  Tylenol or ibuprofen if needed for headache, fever or body aches.  You are also contagious for 24 hours until you have had 3 doses of the Keflex in your system.

## 2018-11-10 NOTE — ED Triage Notes (Signed)
Sore throat started 2 days ago, daughter has strep throat. NAD.

## 2018-11-10 NOTE — ED Notes (Signed)
RN to bedside to update pt on test results and give pt water. Pt requesting she have her urine tested for  UTI. Provider made aware.

## 2018-11-10 NOTE — ED Provider Notes (Signed)
[facility] Emergency Department Provider Note  ____________________________________________   First MD Initiated Contact with Patient 11/10/18 [id number]     (approximate)  I have reviewed the triage vital signs and the nursing notes.   HISTORY  Chief Complaint Sore Throat  HPI Alicia Costa is a 23 y.o. female to the ED with complaint of sore throat for 2 days.  She is not aware of any fever.  She occasionally coughs.  She denies any changes in her taste or smell.  There is been no known exposure to COVID.  She states that her daughter had strep throat 2 to 3 weeks ago.  She denies any urinary or GI symptoms.  She rates her pain as an 8 out of 10.     History reviewed. No pertinent past medical history.  Patient Active Problem List   Diagnosis Date Noted  . Overdose 03/04/2017  . 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 Alcohol abuse 03/04/2017  . Cocaine abuse (HCC) 03/04/2017  . Hemorrhoids   . Indication for care or intervention related to labor and delivery 11/22/2014  . False labor after 37 completed weeks of gestation 11/20/2014  . Pelvic pain affecting pregnancy 10/18/2014  . Indication for care in labor and delivery, antepartum 10/18/2014  . Pregnancy 07/28/2014  . Pyelonephritis 07/28/2014  . Pyelonephritis affecting pregnancy in second trimester, antepartum 07/23/2014    Past Surgical History:  Procedure Laterality Date  . EVALUATION UNDER ANESTHESIA WITH HEMORRHOIDECTOMY N/A 06/24/2016   Procedure: EXAM UNDER ANESTHESIA WITH HEMORRHOIDECTOMY;  Surgeon: Leafy Roiego F Pabon, MD;  Location: [facility] ORS;  Service: General;  Laterality: N/A;    Prior to Admission medications   Medication Sig Start Date End Date Taking? Authorizing Provider  cephALEXin (KEFLEX) 500 MG capsule Take 1 capsule (500 mg total) by mouth 3 (three) times daily. 11/10/18   Tommi RumpsSummers,  L, PA-C  docusate sodium (COLACE) 100 MG capsule Take 100 mg by mouth  daily as needed for mild constipation.    [provider]  sertraline (ZOLOFT) 25 MG tablet Take 25 mg by mouth daily. 05/23/16   [provider]    Allergies Sulfa antibiotics  Family History  Problem Relation Age of Onset  . Cancer Maternal Grandmother   . Cancer Maternal Grandfather   . Hypertension Paternal Grandmother   . Diabetes Paternal Grandmother     Social History Social History   Tobacco Use  . Smoking status: Current Every Day Smoker    Packs/day: 0.25    Types: Cigarettes  . Smokeless tobacco: Never Used  Substance Use Topics  . Alcohol use: Yes    Comment: everyweekend  . Drug use: Yes    Frequency: 7.0 times per week    Types: Marijuana    Review of Systems Constitutional: No objective fever/chills Eyes: No visual changes. ENT: Positive for sore throat. Cardiovascular: Denies chest pain. Respiratory: Denies shortness of breath. Gastrointestinal: No abdominal pain.  No nausea, no vomiting.   Genitourinary: Questionable dysuria. Musculoskeletal: Negative for back pain. Skin: Negative for rash. Neurological: Negative for headaches, focal weakness or numbness. ___________________________________________   PHYSICAL EXAM:  VITAL SIGNS: ED Triage Vitals  Enc Vitals Group     BP 11/10/18 0934 (!) 149/88     Pulse Rate 11/10/18 0934 99     Resp 11/10/18 0934 16     Temp 11/10/18 0934 99.6 F (37.6 C)     Temp Source 11/10/18 0934 Oral     SpO2 11/10/18  0934 100 %     Weight 11/10/18 0929 180 lb (81.6 kg)     Height 11/10/18 0929 5\' 7"  (1.702 m)     Head Circumference --      Peak Flow --      Pain Score 11/10/18 0928 8     Pain Loc --      Pain Edu? --      Excl. in [place]? --     Constitutional: Alert and oriented. Well appearing and in no acute distress. Eyes: Conjunctivae are normal.  Head: Atraumatic. Nose: No congestion/rhinnorhea. Mouth/Throat: Mucous membranes are moist.  Oropharynx mild erythema with minimal amount of  exudate noted bilateral  tonsils.  Uvula is midline. Neck: No stridor.   Hematological/Lymphatic/Immunilogical: No cervical lymphadenopathy. Cardiovascular: Normal rate, regular rhythm. Grossly normal heart sounds.  Good peripheral circulation. Respiratory: Normal respiratory effort.  No retractions. Lungs CTAB. Gastrointestinal: Soft and nontender. No distention.  No CVA tenderness. Musculoskeletal: Moves upper and lower extremities without difficulty.  Normal gait was noted. Neurologic:  Normal speech and language. No gross focal neurologic deficits are appreciated. No gait instability. Skin:  Skin is warm, dry and intact. No rash noted. Psychiatric: Mood and affect are normal. Speech and behavior are normal.  ____________________________________________   LABS (all labs ordered are listed, but only abnormal results are displayed)  Labs Reviewed  GROUP A STREP BY PCR - Abnormal; Notable for the following components:      Result Value   Group A Strep by PCR DETECTED (*)    All other components within normal limits  URINALYSIS, COMPLETE (UACMP) WITH MICROSCOPIC - Abnormal; Notable for the following components:   Color, Urine YELLOW (*)    APPearance CLOUDY (*)    Leukocytes,Ua MODERATE (*)    All other components within normal limits    PROCEDURES  Procedure(s) performed (including Critical Care):  Procedures   ____________________________________________   INITIAL IMPRESSION / ASSESSMENT AND PLAN / ED COURSE  As part of my medical decision making, I reviewed the following data within the electronic MEDICAL RECORD NUMBER Notes from prior ED visits and [place] Controlled Substance Database  23 year old female presents to the ED with complaint of sore throat, subjective fever and also frequent urination.  She initially said that she did not have any urinary symptoms other than going frequently because she was drinking more fluids.  Urinalysis was suspicious for an early UTI with patient  having cystitis symptoms with further questioning.  Strep test was positive.  Patient was discharged with a prescription for Keflex 500 mg 3 times daily for 10 days.  She is to follow-up with her PCP if any continued problems.   ____________________________________________   FINAL CLINICAL IMPRESSION(S) / ED DIAGNOSES  Final diagnoses:  Strep pharyngitis  Cystitis     ED Discharge Orders         Ordered    cephALEXin (KEFLEX) 500 MG capsule  3 times daily     11/10/18 1158           Note:  This document was prepared using Dragon voice recognition software and may include unintentional dictation errors.    Johnn Hai, PA-C 11/10/18 1204    Blake Divine, MD 11/10/18 [phone]

## 2018-11-29 ENCOUNTER — Emergency Department: Payer: Self-pay

## 2018-11-29 ENCOUNTER — Other Ambulatory Visit: Payer: Self-pay

## 2018-11-29 ENCOUNTER — Emergency Department
Admission: EM | Admit: 2018-11-29 | Discharge: 2018-11-29 | Disposition: A | Discharge status: Home / Self Care | Payer: Self-pay | Attending: Emergency Medicine | Admitting: Emergency Medicine

## 2018-11-29 ENCOUNTER — Encounter: Payer: Self-pay | Admitting: Emergency Medicine

## 2018-11-29 DIAGNOSIS — F1210 Cannabis abuse, uncomplicated: Secondary | ICD-10-CM | POA: Insufficient documentation

## 2018-11-29 DIAGNOSIS — Y92019 Unspecified place in single-family (private) house as the place of occurrence of the external cause: Secondary | ICD-10-CM | POA: Insufficient documentation

## 2018-11-29 DIAGNOSIS — S8002XA Contusion of left knee, initial encounter: Secondary | ICD-10-CM

## 2018-11-29 DIAGNOSIS — F17210 Nicotine dependence, cigarettes, uncomplicated: Secondary | ICD-10-CM | POA: Insufficient documentation

## 2018-11-29 DIAGNOSIS — Y9389 Activity, other specified: Secondary | ICD-10-CM | POA: Insufficient documentation

## 2018-11-29 DIAGNOSIS — Z79899 Other long term (current) drug therapy: Secondary | ICD-10-CM | POA: Insufficient documentation

## 2018-11-29 DIAGNOSIS — W19XXXA Unspecified fall, initial encounter: Secondary | ICD-10-CM | POA: Insufficient documentation

## 2018-11-29 DIAGNOSIS — Y998 Other external cause status: Secondary | ICD-10-CM | POA: Insufficient documentation

## 2018-11-29 MED ORDER — TRAMADOL HCL 50 MG PO TABS
50.0000 mg | ORAL_TABLET | Freq: Once | ORAL | Status: AC
  Administered 2018-11-29: 50 mg via ORAL
  Filled 2018-11-29: qty 1

## 2018-11-29 MED ORDER — IBUPROFEN 600 MG PO TABS: 600.0000 mg | ORAL_TABLET | Freq: Three times a day (TID) | ORAL | 0 refills | Status: DC | PRN

## 2018-11-29 MED ORDER — IBUPROFEN 600 MG PO TABS
600.0000 mg | ORAL_TABLET | Freq: Once | ORAL | Status: AC
  Administered 2018-11-29: 600 mg via ORAL
  Filled 2018-11-29: qty 1

## 2018-11-29 MED ORDER — TRAMADOL HCL 50 MG PO TABS: 50.0000 mg | ORAL_TABLET | Freq: Two times a day (BID) | ORAL | 0 refills | Status: AC | PRN

## 2018-11-29 NOTE — ED Provider Notes (Signed)
[facility] Emergency Department Provider Note   ____________________________________________   First MD Initiated Contact with Patient 11/29/18 1257     (approximate)  I have reviewed the triage vital signs and the nursing notes.   HISTORY  Chief Complaint Knee Pain    HPI Alicia Costa is a 23 y.o. female patient complain left knee pain secondary to a fall yesterday at home.  Patient did pain increased with weightbearing.  Patient denies loss of sensation.  Patient rates the pain is 8/10.  Patient described the pain as "achy".  No palliative measure for complaint.         History reviewed. No pertinent past medical history.  Patient Active Problem List   Diagnosis Date Noted  . Overdose 03/04/2017  . Moderate recurrent major depression ([place]) 03/04/2017  . Alcohol abuse 03/04/2017  . Cocaine abuse ([place]) 03/04/2017  . Hemorrhoids   . Indication for care or intervention related to labor and delivery 11/22/2014  . False labor after 37 completed weeks of gestation 11/20/2014  . Pelvic pain affecting pregnancy 10/18/2014  . Indication for care in labor and delivery, antepartum 10/18/2014  . Pregnancy 07/28/2014  . Pyelonephritis 07/28/2014  . Pyelonephritis affecting pregnancy in second trimester, antepartum 07/23/2014    Past Surgical History:  Procedure Laterality Date  . EVALUATION UNDER ANESTHESIA WITH HEMORRHOIDECTOMY N/A 06/24/2016   Procedure: EXAM UNDER ANESTHESIA WITH HEMORRHOIDECTOMY;  Surgeon: Jules Husbands, MD;  Location: [facility] ORS;  Service: General;  Laterality: N/A;    Prior to Admission medications   Medication Sig Start Date End Date Taking? Authorizing Provider  cephALEXin (KEFLEX) 500 MG capsule Take 1 capsule (500 mg total) by mouth 3 (three) times daily. 11/10/18   Johnn Hai, PA-C  docusate sodium (COLACE) 100 MG capsule Take 100 mg by mouth daily as needed for mild constipation.    [provider]   ibuprofen (ADVIL) 600 MG tablet Take 1 tablet (600 mg total) by mouth every 8 (eight) hours as needed. 11/29/18   Sable Feil, PA-C  sertraline (ZOLOFT) 25 MG tablet Take 25 mg by mouth daily. 05/23/16   [provider]  traMADol (ULTRAM) 50 MG tablet Take 1 tablet (50 mg total) by mouth every 12 (twelve) hours as needed for up to 5 days. 11/29/18 12/04/18  Sable Feil, PA-C    Allergies Patient has no known allergies.  Family History  Problem Relation Age of Onset  . Cancer Maternal Grandmother   . Cancer Maternal Grandfather   . Hypertension Paternal Grandmother   . Diabetes Paternal Grandmother     Social History Social History   Tobacco Use  . Smoking status: Current Every Day Smoker    Packs/day: 0.25    Types: Cigarettes  . Smokeless tobacco: Never Used  Substance Use Topics  . Alcohol use: Yes    Comment: everyweekend  . Drug use: Yes    Frequency: 7.0 times per week    Types: Marijuana    Review of Systems Constitutional: No fever/chills Eyes: No visual changes. ENT: No sore throat. Cardiovascular: Denies chest pain. Respiratory: Denies shortness of breath. Gastrointestinal: No abdominal pain.  No nausea, no vomiting.  No diarrhea.  No constipation. Genitourinary: Negative for dysuria. Musculoskeletal: Left anterior knee pain. Skin: Negative for rash. Neurological: Negative for headaches, focal weakness or numbness.   ____________________________________________   PHYSICAL EXAM:  VITAL SIGNS: ED Triage Vitals  Enc Vitals Group     BP 11/29/18 1233 Marland Kitchen)  148/93     Pulse Rate 11/29/18 1233 (!) 103     Resp 11/29/18 1233 18     Temp 11/29/18 1233 98.3 F (36.8 C)     Temp Source 11/29/18 1233 Oral     SpO2 11/29/18 1233 100 %     Weight 11/29/18 1233 180 lb (81.6 kg)     Height 11/29/18 1233 5\' 8"  (1.727 m)     Head Circumference --      Peak Flow --      Pain Score 11/29/18 1241 8     Pain Loc --      Pain Edu? --      Excl. in GC?  --    Constitutional: Alert and oriented. Well appearing and in no acute distress. Cardiovascular: Normal rate, regular rhythm. Grossly normal heart sounds.  Good peripheral circulation. Respiratory: Normal respiratory effort.  No retractions. Lungs CTAB. Musculoskeletal: Obvious deformity to the left knee.  Moderate guarding palpation anterior patella.  Patient has full neck range of motion.   Neurologic:  Normal speech and language. No gross focal neurologic deficits are appreciated. No gait instability. Skin:  Skin is warm, dry and intact. No rash noted.  No abrasion or ecchymosis. Psychiatric: Mood and affect are normal. Speech and behavior are normal.  ____________________________________________   LABS (all labs ordered are listed, but only abnormal results are displayed)  Labs Reviewed - No data to display ____________________________________________  EKG   ____________________________________________  RADIOLOGY  ED MD interpretation:    Official radiology report(s): Dg Knee Complete 4 Views Left  Result Date: 11/29/2018 CLINICAL DATA:  Fall last night on kitchen floor landing on left knee. EXAM: LEFT KNEE - COMPLETE 4+ VIEW COMPARISON:  None. FINDINGS: No evidence of fracture, dislocation, or joint effusion. No evidence of arthropathy or other focal bone abnormality. Soft tissues are unremarkable. IMPRESSION: Negative. Electronically Signed   By: 01/29/2019 M.D.   On: 11/29/2018 13:27    ____________________________________________   PROCEDURES  Procedure(s) performed (including Critical Care):  Procedures   ____________________________________________   INITIAL IMPRESSION / ASSESSMENT AND PLAN / ED COURSE  As part of my medical decision making, I reviewed the following data within the electronic MEDICAL RECORD NUMBER         Alicia Costa was evaluated in Emergency Department on 11/29/2018 for the symptoms described in the history of present illness.  She was evaluated in the context of the global COVID-19 pandemic, which necessitated consideration that the patient might be at risk for infection with the SARS-CoV-2 virus that causes COVID-19. Institutional protocols and algorithms that pertain to the evaluation of patients at risk for COVID-19 are in a state of rapid change based on information released by regulatory bodies including the CDC and federal and state organizations. These policies and algorithms were followed during the patient's care in the ED.  Patient presents with left knee pain secondary to fall.  Physical exam is consistent with contusion.  Discussed negative x-ray findings with patient.  Patient placed in a knee immobilizer and given discharge care instruction.  Take medication as directed.  Follow-up PCP if no improvement in 3 to 5 days.      ____________________________________________   FINAL CLINICAL IMPRESSION(S) / ED DIAGNOSES  Final diagnoses:  Contusion of left knee, initial encounter     ED Discharge Orders         Ordered    traMADol (ULTRAM) 50 MG tablet  Every 12 hours PRN  11/29/18 1335    ibuprofen (ADVIL) 600 MG tablet  Every 8 hours PRN     11/29/18 1335           Note:  This document was prepared using Dragon voice recognition software and may include unintentional dictation errors.    Joni ReiningSmith, Libia Fazzini K, PA-C 11/29/18 1339    Dionne BucySiadecki, Sebastian, MD 11/29/18 [id number]

## 2018-11-29 NOTE — ED Notes (Signed)
topez not working 

## 2018-11-29 NOTE — ED Triage Notes (Signed)
L knee pain since fell last night.

## 2019-03-20 ENCOUNTER — Telehealth: Payer: Self-pay | Admitting: Family Medicine

## 2019-03-20 NOTE — Telephone Encounter (Signed)
WANTS NEXPLANON REMOVED

## 2019-03-23 ENCOUNTER — Telehealth: Payer: Self-pay

## 2019-03-23 NOTE — Telephone Encounter (Signed)
TC with patient re: Nexplanon appt 03/25/2019. First phone call patient stated she was already on the other line with the health dept.  RN was put on hold for 3 mins by patient.   2nd TC to patient RN left message that [facility] needed to cancel FP Nexplanon appt for this week d/t covid restrictions and patient has never been seen here before. Appt cancelled. Informed patient on VM that she could call back with questions.  RN direct # provided. Richmond Campbell, RN

## 2019-03-23 NOTE — Telephone Encounter (Signed)
TC with patient.  Requesting appt for Nexplanon removal; states implant is making her stomach hurt (5 months) and she feels she needs a new one. RN attempted to explain to patient that the Nexplanon is good up to 4 years per Dr. Alvester Morin. Patient is adamant that she has an appt for Nexplanon removal this week.   Unsure where patient had the implant placed however there is a directives note in centricity from [employer] CNM stating implant was placed 04/2016. Appt scheduled 03/25/19 Richmond Campbell, RN

## 2019-03-23 NOTE — Telephone Encounter (Signed)
Correction to note- patient has been seen at [facility] before but not for [facility] services. Patient added to wait list for [facility] services.  Richmond Campbell, RN

## 2019-03-25 ENCOUNTER — Ambulatory Visit: Payer: Self-pay

## 2019-03-27 ENCOUNTER — Ambulatory Visit (LOCAL_COMMUNITY_HEALTH_CENTER): Payer: Self-pay | Admitting: Family Medicine

## 2019-03-27 ENCOUNTER — Encounter: Payer: Self-pay | Admitting: Family Medicine

## 2019-03-27 ENCOUNTER — Other Ambulatory Visit: Payer: Self-pay

## 2019-03-27 VITALS — BP 146/89 | Ht 67.0 in | Wt 183.2 lb

## 2019-03-27 DIAGNOSIS — R112 Nausea with vomiting, unspecified: Secondary | ICD-10-CM

## 2019-03-27 DIAGNOSIS — Z30017 Encounter for initial prescription of implantable subdermal contraceptive: Secondary | ICD-10-CM

## 2019-03-27 DIAGNOSIS — Z3046 Encounter for surveillance of implantable subdermal contraceptive: Secondary | ICD-10-CM

## 2019-03-27 DIAGNOSIS — Z113 Encounter for screening for infections with a predominantly sexual mode of transmission: Secondary | ICD-10-CM

## 2019-03-27 LAB — WET PREP FOR TRICH, YEAST, CLUE
Trichomonas Exam: NEGATIVE
Yeast Exam: NEGATIVE

## 2019-03-27 MED ORDER — ETONOGESTREL 68 MG ~~LOC~~ IMPL
68.0000 mg | DRUG_IMPLANT | Freq: Once | SUBCUTANEOUS | Status: AC
  Administered 2019-03-27: 68 mg via SUBCUTANEOUS

## 2019-03-27 MED ORDER — THERA VITAL M PO TABS: 1.0000 | ORAL_TABLET | Freq: Every day | ORAL | 0 refills | Status: AC

## 2019-03-27 NOTE — Progress Notes (Signed)
Family Planning Visit- Initial Visit  Subjective:  Alicia Costa is a 24 y.o. being seen today for an initial well woman visit and to discuss family planning options.  She is currently using Nexplanon for pregnancy prevention. Patient reports she does not want a pregnancy in the next year.  Patient has the following medical conditions has Hemorrhoids; Overdose; Moderate recurrent major depression (HCC); Alcohol abuse; and Cocaine abuse (HCC) on their problem list.  Chief Complaint  Patient presents with  . Contraception    Patient reports frequent GI sx with nausea and vomiting several times per week. She reports this is only after eating and she just "can't keep down food" and she reports this has been happening for several months and she has not sought care for this but does have a PCP at [facility].   Desires STI screening today- will self self collect GC/CT. Thinks last pap was at [facility].   Patient denies vaginal discharge, vaginal bleeding, abdominal pain, or any other GYN complaint.   Body mass index is 28.69 kg/m. - Patient is eligible for diabetes screening based on BMI and age >55?  no HA1C ordered? no  Patient reports 1 of partners in last year. Desires STI screening?  Yes  Does the patient have a current or past history of drug use? No per patient-- reviewing problem list it has cocaine use on the list but she denies use. Available UDS data in Epic shows positive cannabinoid in 2016.   No components found for: HCV]   Health Maintenance Due  Topic Date Due  . PAP-Cervical Cytology Screening  02/14/2015  . PAP SMEAR-Modifier  02/14/2015  . INFLUENZA VACCINE  09/27/2018    Review of Systems  Constitutional: Negative for chills and fever.  Eyes: Negative for blurred vision and double vision.  Respiratory: Negative for cough and shortness of breath.   Cardiovascular: Negative for chest pain and orthopnea.  Gastrointestinal: Positive for nausea and  vomiting.  Genitourinary: Negative for dysuria, flank pain and frequency.  Musculoskeletal: Negative for myalgias.  Skin: Negative for rash.  Neurological: Negative for dizziness, tingling, weakness and headaches.  Endo/Heme/Allergies: Does not bruise/bleed easily.  Psychiatric/Behavioral: Negative for depression and suicidal ideas. The patient is not nervous/anxious.     The following portions of the patient's history were reviewed and updated as appropriate: allergies, current medications, past family history, past medical history, past social history, past surgical history and problem list. Problem list updated.   See flowsheet for other program required questions.  Objective:   Vitals:   03/27/19 1316  BP: (!) 146/89  Weight: 183 lb 3.2 oz (83.1 kg)  Height: 5\' 7"  (1.702 m)    Physical Exam Constitutional:      Appearance: Normal appearance.  HENT:     Head: Normocephalic and atraumatic.  Pulmonary:     Effort: Pulmonary effort is normal.  Abdominal:     Palpations: Abdomen is soft.  Musculoskeletal:        General: Normal range of motion.  Skin:    General: Skin is warm and dry.  Neurological:     General: No focal deficit present.     Mental Status: She is alert.  Psychiatric:        Mood and Affect: Mood normal.        Behavior: Behavior normal.     Assessment and Plan:  Alicia Costa is a 24 y.o. female presenting to the [facility] Department for an initial  well woman exam/family planning visit  Contraception counseling: Reviewed all forms of birth control options in the tiered based approach. available including abstinence; over the counter/barrier methods; hormonal contraceptive medication including pill, patch, ring, injection,contraceptive implant; hormonal and nonhormonal IUDs; permanent sterilization options including vasectomy and the various tubal sterilization modalities. Risks, benefits, and typical effectiveness rates were reviewed.   Questions were answered.  Written information was also given to the patient to review.  Patient desires replacement of Nexplanon, this was prescribed for patient. She will follow up in  1 yr for surveillance.  She was told to call with any further questions, or with any concerns about this method of contraception.  Emphasized use of condoms 100% of the time for STI prevention.  1. Screening examination for venereal disease Treat wet mount per standning - HIV [place] [facility] - Syphilis Serology, [place] [facility] - Chlamydia/Gonorrhea  Lab - WET PREP FOR [place], YEAST, CLUE - Did not order HCV based on patient report of no drug use in the past or currently. I specifically asked about MJ use and patient denied,   2. Nausea/Vomiting - Recommended PCP follow up as this does not seem related to contraception and my need RUQ [place] to rule out gallstones  3. Nexplanon- removed easily and replaced.   4. Health care maintenance - patient is unsure of last pap and thought she had received on here but patient has not been seen for FP services at our agency. She has had pelvic exams here through our STD clinic - She reports pap was likely done at [facility]. Encouraged patient to check with this clinic to see if she need a pap smear and encouraged to make appt her if she desire to switch her pap care here. We would need an ROI for paps signed at that time.   Return in about 1 year (around 03/26/2020) for Family planning visit.  No future appointments.  Caren Macadam, MD

## 2019-03-27 NOTE — Progress Notes (Signed)
In desiring Nexplanon removal/reinsertion and STD screening Sharlette Dense, RN

## 2019-06-18 ENCOUNTER — Ambulatory Visit: Payer: Self-pay

## 2019-06-19 ENCOUNTER — Ambulatory Visit: Payer: Self-pay | Admitting: Physician Assistant

## 2019-06-19 ENCOUNTER — Encounter: Payer: Self-pay | Admitting: Physician Assistant

## 2019-06-19 ENCOUNTER — Other Ambulatory Visit: Payer: Self-pay

## 2019-06-19 DIAGNOSIS — Z113 Encounter for screening for infections with a predominantly sexual mode of transmission: Secondary | ICD-10-CM

## 2019-06-19 LAB — WET PREP FOR TRICH, YEAST, CLUE
Trichomonas Exam: NEGATIVE
Yeast Exam: NEGATIVE

## 2019-06-19 NOTE — Progress Notes (Signed)
Wet mount reviewed with provider, no treatment given today. Patient counseled that if irritation/discharge continue after the end of her period, she should call for another appointment. Burt Knack, RN

## 2019-06-19 NOTE — Progress Notes (Signed)
Patient here for STD testing.Alicia Birge Brewer-Jensen, RN 

## 2019-06-21 NOTE — Progress Notes (Signed)
[facility] Department STI clinic/screening visit  Subjective:  Alicia Costa is a 24 y.o. female being seen today for an STI screening visit. The patient reports they do have symptoms.  Patient reports that they do not desire a pregnancy in the next year.   They reported they are not interested in discussing contraception today.  Patient's last menstrual period was 06/09/2019 (approximate).   Patient has the following medical conditions:   Patient Active Problem List   Diagnosis Date Noted  . Overdose 03/04/2017  . 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 Alcohol abuse 03/04/2017  . Cocaine abuse (HCC) 03/04/2017  . Hemorrhoids     Chief Complaint  Patient presents with  . SEXUALLY TRANSMITTED DISEASE    HPI  Patient reports that she had yellow discharge for 1 week before her period started.  States that she did not have itching, burning, odor or other symptoms, just the discharge with "a different color".  States LMP 06/10/2019 and still having some spotting.  Using Nexplanon as BCM.  See flowsheet for further details and programmatic requirements.    The following portions of the patient's history were reviewed and updated as appropriate: allergies, current medications, past medical history, past social history, past surgical history and problem list.  Objective:  There were no vitals filed for this visit.  Physical Exam Constitutional:      General: She is not in acute distress.    Appearance: Normal appearance.  HENT:     Head: Normocephalic and atraumatic.     Comments: No nits, lice or hair loss. No cervical, supraclavicular or axillary adenopathy.    Mouth/Throat:     Mouth: Mucous membranes are moist.     Pharynx: Oropharynx is clear. No oropharyngeal exudate or posterior oropharyngeal erythema.  Eyes:     Conjunctiva/sclera: Conjunctivae normal.  Pulmonary:     Effort: Pulmonary effort is normal.  Abdominal:     Palpations:  Abdomen is soft. There is no mass.     Tenderness: There is no abdominal tenderness. There is no rebound.  Genitourinary:    General: Normal vulva.     Rectum: Normal.     Comments: External genitalia/pubic area without nits, lice, edema, erythema, lesions and inguinal adenopathy. Vagina with normal mucosa and moderate amount of pinkish/bloody discharge. Cervix without visible lesions. Uterus firm, mobile, nt, no masses, no CMT, no adnexal tenderness or fullness. Musculoskeletal:     Cervical back: Neck supple. No tenderness.  Skin:    General: Skin is warm and dry.     Findings: No bruising, erythema, lesion or rash.  Neurological:     Mental Status: She is alert and oriented to person, place, and time.  Psychiatric:        Mood and Affect: Mood normal.        Behavior: Behavior normal.        Thought Content: Thought content normal.        Judgment: Judgment normal.      Assessment and Plan:  Alicia Costa is a 24 y.o. female presenting to the [facility] Department for STI screening  1. Screening for STD (sexually transmitted disease) Patient into clinic without symptoms.  Declines blood work today. Rec condoms with all sex. Await test results.  Counseled that RN will call if needs to RTC for treatment once results are back. - WET PREP FOR TRICH, YEAST, CLUE - Gonococcus culture - Chlamydia/Gonorrhea [place] [facility]  No follow-ups on file.  No future appointments.  Jerene Dilling, PA

## 2019-06-23 LAB — GONOCOCCUS CULTURE

## 2019-10-09 ENCOUNTER — Ambulatory Visit: Payer: Self-pay

## 2019-11-11 ENCOUNTER — Other Ambulatory Visit: Payer: Self-pay

## 2019-11-11 DIAGNOSIS — R1084 Generalized abdominal pain: Secondary | ICD-10-CM | POA: Insufficient documentation

## 2019-11-11 DIAGNOSIS — Z5321 Procedure and treatment not carried out due to patient leaving prior to being seen by health care provider: Secondary | ICD-10-CM | POA: Insufficient documentation

## 2019-11-11 NOTE — ED Triage Notes (Signed)
Pt in with co generalized abd pain that started a few days ago. States "I think it is cause I got drunk, but not drunk drunk". States today dog jumped on her today and her abd has been more tender. Pt states positive etoh and marijuana today.

## 2019-11-12 ENCOUNTER — Other Ambulatory Visit: Payer: Self-pay

## 2019-11-12 ENCOUNTER — Emergency Department
Admission: EM | Admit: 2019-11-12 | Discharge: 2019-11-12 | Disposition: A | Discharge status: Home / Self Care | Payer: Self-pay | Attending: Emergency Medicine | Admitting: Emergency Medicine

## 2019-11-12 LAB — COMPREHENSIVE METABOLIC PANEL
ALT: 27 U/L (ref 0–44)
AST: 37 U/L (ref 15–41)
Albumin: 4.2 g/dL (ref 3.5–5.0)
Alkaline Phosphatase: 71 U/L (ref 38–126)
Anion gap: 13 (ref 5–15)
BUN: 6 mg/dL (ref 6–20)
CO2: 23 mmol/L (ref 22–32)
Calcium: 9.6 mg/dL (ref 8.9–10.3)
Chloride: 104 mmol/L (ref 98–111)
Creatinine, Ser: 0.74 mg/dL (ref 0.44–1.00)
GFR calc Af Amer: >60 mL/min (ref >60)
GFR calc non Af Amer: >60 mL/min (ref >60)
Glucose, Bld: 112 mg/dL — ABNORMAL HIGH (ref 70–99)
Potassium: 3.1 mmol/L — ABNORMAL LOW (ref 3.5–5.1)
Sodium: 140 mmol/L (ref 135–145)
Total Bilirubin: 0.4 mg/dL (ref 0.3–1.2)
Total Protein: 8.4 g/dL — ABNORMAL HIGH (ref 6.5–8.1)

## 2019-11-12 LAB — URINALYSIS, COMPLETE (UACMP) WITH MICROSCOPIC
Bacteria, UA: NONE SEEN
Bilirubin Urine: NEGATIVE
Glucose, UA: NEGATIVE mg/dL
Ketones, ur: NEGATIVE mg/dL
Leukocytes,Ua: NEGATIVE
Nitrite: NEGATIVE
Protein, ur: NEGATIVE mg/dL
Specific Gravity, Urine: 1.001 — ABNORMAL LOW (ref 1.005–1.030)
pH: 7 (ref 5.0–8.0)

## 2019-11-12 LAB — URINE DRUG SCREEN, QUALITATIVE (ARMC ONLY)
Amphetamines, Ur Screen: NOT DETECTED
Barbiturates, Ur Screen: NOT DETECTED
Benzodiazepine, Ur Scrn: NOT DETECTED
Cannabinoid 50 Ng, Ur ~~LOC~~: POSITIVE — AB
Cocaine Metabolite,Ur ~~LOC~~: POSITIVE — AB
MDMA (Ecstasy)Ur Screen: NOT DETECTED
Methadone Scn, Ur: NOT DETECTED
Opiate, Ur Screen: NOT DETECTED
Phencyclidine (PCP) Ur S: NOT DETECTED
Tricyclic, Ur Screen: NOT DETECTED

## 2019-11-12 LAB — CBC
HCT: 36.8 % (ref 36.0–46.0)
Hemoglobin: 11.8 g/dL — ABNORMAL LOW (ref 12.0–15.0)
MCH: 27.9 pg (ref 26.0–34.0)
MCHC: 32.1 g/dL (ref 30.0–36.0)
MCV: 87 fL (ref 80.0–100.0)
Platelets: 303 10*3/uL (ref 150–400)
RBC: 4.23 MIL/uL (ref 3.87–5.11)
RDW: 14.2 % (ref 11.5–15.5)
WBC: 5.7 10*3/uL (ref 4.0–10.5)
nRBC: 0 % (ref 0.0–0.2)

## 2019-11-12 LAB — ETHANOL: Alcohol, Ethyl (B): 227 mg/dL — ABNORMAL HIGH (ref <10)

## 2019-11-12 LAB — POCT PREGNANCY, URINE: Preg Test, Ur: NEGATIVE

## 2019-11-12 LAB — LIPASE, BLOOD: Lipase: 26 U/L (ref 11–51)

## 2019-11-12 NOTE — ED Notes (Signed)
Called pt to re-check VS. Unable to locate pt at this time just outside lobby or in waiting room.

## 2020-01-31 ENCOUNTER — Emergency Department
Admission: EM | Admit: 2020-01-31 | Discharge: 2020-02-01 | Disposition: A | Discharge status: Home / Self Care | Payer: Self-pay | Attending: Emergency Medicine | Admitting: Emergency Medicine

## 2020-01-31 ENCOUNTER — Encounter: Payer: Self-pay | Admitting: Emergency Medicine

## 2020-01-31 DIAGNOSIS — F1994 Other psychoactive substance use, unspecified with psychoactive substance-induced mood disorder: Secondary | ICD-10-CM

## 2020-01-31 DIAGNOSIS — F331 Major depressive disorder, recurrent, moderate: Secondary | ICD-10-CM | POA: Insufficient documentation

## 2020-01-31 DIAGNOSIS — R456 Violent behavior: Secondary | ICD-10-CM | POA: Insufficient documentation

## 2020-01-31 DIAGNOSIS — Z7289 Other problems related to lifestyle: Secondary | ICD-10-CM

## 2020-01-31 DIAGNOSIS — Z789 Other specified health status: Secondary | ICD-10-CM

## 2020-01-31 DIAGNOSIS — F17210 Nicotine dependence, cigarettes, uncomplicated: Secondary | ICD-10-CM | POA: Insufficient documentation

## 2020-01-31 DIAGNOSIS — F1099 Alcohol use, unspecified with unspecified alcohol-induced disorder: Secondary | ICD-10-CM | POA: Insufficient documentation

## 2020-01-31 DIAGNOSIS — Z79899 Other long term (current) drug therapy: Secondary | ICD-10-CM | POA: Insufficient documentation

## 2020-01-31 DIAGNOSIS — F410 Panic disorder [episodic paroxysmal anxiety] without agoraphobia: Secondary | ICD-10-CM | POA: Insufficient documentation

## 2020-01-31 DIAGNOSIS — R4689 Other symptoms and signs involving appearance and behavior: Secondary | ICD-10-CM

## 2020-01-31 DIAGNOSIS — Y907 Blood alcohol level of 200-239 mg/100 ml: Secondary | ICD-10-CM | POA: Insufficient documentation

## 2020-01-31 DIAGNOSIS — F39 Unspecified mood [affective] disorder: Secondary | ICD-10-CM | POA: Insufficient documentation

## 2020-01-31 LAB — CBC WITH DIFFERENTIAL/PLATELET
Abs Immature Granulocytes: 0.02 10*3/uL (ref 0.00–0.07)
Basophils Absolute: 0 10*3/uL (ref 0.0–0.1)
Basophils Relative: 0 %
Eosinophils Absolute: 0 10*3/uL (ref 0.0–0.5)
Eosinophils Relative: 0 %
HCT: 37.2 % (ref 36.0–46.0)
Hemoglobin: 11.7 g/dL — ABNORMAL LOW (ref 12.0–15.0)
Immature Granulocytes: 0 %
Lymphocytes Relative: 35 %
Lymphs Abs: 2.5 10*3/uL (ref 0.7–4.0)
MCH: 27.5 pg (ref 26.0–34.0)
MCHC: 31.5 g/dL (ref 30.0–36.0)
MCV: 87.3 fL (ref 80.0–100.0)
Monocytes Absolute: 0.6 10*3/uL (ref 0.1–1.0)
Monocytes Relative: 9 %
Neutro Abs: 3.8 10*3/uL (ref 1.7–7.7)
Neutrophils Relative %: 56 %
Platelets: 282 10*3/uL (ref 150–400)
RBC: 4.26 MIL/uL (ref 3.87–5.11)
RDW: 14.5 % (ref 11.5–15.5)
WBC: 7 10*3/uL (ref 4.0–10.5)
nRBC: 0 % (ref 0.0–0.2)

## 2020-01-31 LAB — ETHANOL: Alcohol, Ethyl (B): 215 mg/dL — ABNORMAL HIGH (ref <10)

## 2020-01-31 LAB — COMPREHENSIVE METABOLIC PANEL
ALT: 36 U/L (ref 0–44)
AST: 35 U/L (ref 15–41)
Albumin: 4 g/dL (ref 3.5–5.0)
Alkaline Phosphatase: 61 U/L (ref 38–126)
Anion gap: 12 (ref 5–15)
BUN: 9 mg/dL (ref 6–20)
CO2: 22 mmol/L (ref 22–32)
Calcium: 8.4 mg/dL — ABNORMAL LOW (ref 8.9–10.3)
Chloride: 109 mmol/L (ref 98–111)
Creatinine, Ser: 0.73 mg/dL (ref 0.44–1.00)
GFR, Estimated: >60 mL/min (ref >60)
Glucose, Bld: 96 mg/dL (ref 70–99)
Potassium: 3.4 mmol/L — ABNORMAL LOW (ref 3.5–5.1)
Sodium: 143 mmol/L (ref 135–145)
Total Bilirubin: 0.4 mg/dL (ref 0.3–1.2)
Total Protein: 7.8 g/dL (ref 6.5–8.1)

## 2020-01-31 LAB — SALICYLATE LEVEL: Salicylate Lvl: <7 mg/dL — ABNORMAL LOW (ref 7.0–30.0)

## 2020-01-31 LAB — ACETAMINOPHEN LEVEL: Acetaminophen (Tylenol), Serum: <10 ug/mL — ABNORMAL LOW (ref 10–30)

## 2020-01-31 NOTE — ED Notes (Signed)
Pt IVC 

## 2020-01-31 NOTE — ED Triage Notes (Signed)
Patient came via EMS. Patient was having a panic attack at home with LOC x4. Patient got aggressive with EMS and was given Haldol and Versed. Patient is sleeping at this time. Respirations even and unlabored at this time with no signs of distress.

## 2020-01-31 NOTE — ED Notes (Signed)
Unable to do BH/psych assessment at this time due to patient being medicated pta.

## 2020-01-31 NOTE — ED Provider Notes (Signed)
[facility] Emergency Department Provider Note   ____________________________________________   I have reviewed the triage vital signs and the nursing notes.   HISTORY  Chief Complaint Panic Attack   History limited by and level 5 caveat due to: Sedation   HPI Alicia Costa is a 24 y.o. female who presents to the emergency department today via EMS. Patient cannot given any history here. EMS was called apparently by sister because of concern for panic attack. There was also report that the patient had alcohol on board. Upon arrival to the scene the patient was unable to be calmed down and then became combative. EMS administered 5mg  of haldol and 2mg  of versed on scene.    Records reviewed. Per medical record review patient has a history of polysubstance abuse, depression.   Past Medical History:  Diagnosis Date  . Pyelonephritis 07/28/2014    Patient Active Problem List   Diagnosis Date Noted  . Overdose 03/04/2017  . 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 Alcohol abuse 03/04/2017  . Cocaine abuse (HCC) 03/04/2017  . Hemorrhoids     Past Surgical History:  Procedure Laterality Date  . EVALUATION UNDER ANESTHESIA WITH HEMORRHOIDECTOMY N/A 06/24/2016   Procedure: EXAM UNDER ANESTHESIA WITH HEMORRHOIDECTOMY;  Surgeon: 05/02/2017, MD;  Location: [facility] ORS;  Service: General;  Laterality: N/A;    Prior to Admission medications   Medication Sig Start Date End Date Taking? Authorizing Provider  cephALEXin (KEFLEX) 500 MG capsule Take 1 capsule (500 mg total) by mouth 3 (three) times daily. 11/10/18   Leafy Ro, PA-C  docusate sodium (COLACE) 100 MG capsule Take 100 mg by mouth daily as needed for mild constipation.    [provider]  etonogestrel (NEXPLANON) 68 MG IMPL implant 1 each by Subdermal route once. 04/26/16   [provider]  ibuprofen (ADVIL) 600 MG tablet Take 1 tablet (600 mg total) by mouth  every 8 (eight) hours as needed. 11/29/18   06/26/16, PA-C  sertraline (ZOLOFT) 25 MG tablet Take 25 mg by mouth daily. 05/23/16   [provider]    Allergies Patient has no known allergies.  Family History  Problem Relation Age of Onset  . Cancer Maternal Grandmother   . Cancer Maternal Grandfather   . Hypertension Paternal Grandmother   . Diabetes Paternal Grandmother     Social History Social History   Tobacco Use  . Smoking status: Current Every Day Smoker    Packs/day: 0.25    Types: Cigarettes  . Smokeless tobacco: Never Used  Substance Use Topics  . Alcohol use: Yes    Comment: everyweekend  . Drug use: Yes    Frequency: 7.0 times per week    Types: Marijuana    Review of Systems Unable to obtain. ____________________________________________   PHYSICAL EXAM:  VITAL SIGNS: ED Triage Vitals  Enc Vitals Group     BP 01/31/20 1934 106/60     Pulse Rate 01/31/20 1934 88     Resp 01/31/20 1934 18     Temp 01/31/20 1934 97.8 F (36.6 C)     Temp Source 01/31/20 1934 Oral     SpO2 01/31/20 1929 98 %     Weight 01/31/20 1935 178 lb 9.2 oz (81 kg)     Height 01/31/20 1935 5\' 7"  (1.702 m)     Head Circumference --      Peak Flow --      Pain Score 01/31/20 1935 Asleep  Pain Loc --      Pain Edu? --      Excl. in GC? --      Constitutional: Responsive to physical stimuli. Eyes: Conjunctivae are normal.  ENT      Head: Normocephalic and atraumatic.      Nose: No congestion/rhinnorhea.      Mouth/Throat: Mucous membranes are moist.      Neck: No stridor. Hematological/Lymphatic/Immunilogical: No cervical lymphadenopathy. Cardiovascular: Normal rate, regular rhythm.  No murmurs, rubs, or gallops.  Respiratory: Normal respiratory effort without tachypnea nor retractions. Breath sounds are clear and equal bilaterally. No wheezes/rales/rhonchi. Gastrointestinal: Soft and non tender. No rebound. No guarding.  Genitourinary:  Deferred Musculoskeletal: Normal range of motion in all extremities. No lower extremity edema. Neurologic:  Responsive to physical stimuli. Skin:  Skin is warm, dry and intact. No rash noted. ____________________________________________    LABS (pertinent positives/negatives)  Ethanol 215 Acetaminophen, salicylate below threshold CBC wbc 7.0, hgb 11.7, plt 282 CMP wnl except k 3.4, ca 8.4  ____________________________________________   EKG  None  ____________________________________________    RADIOLOGY  None  ____________________________________________   PROCEDURES  Procedures  ____________________________________________   INITIAL IMPRESSION / ASSESSMENT AND PLAN / ED COURSE  Pertinent labs & imaging results that were available during my care of the patient were reviewed by me and considered in my medical decision making (see chart for details).   Patient presented to the emergency department today because of concerns for alcohol abuse and aggressive behavior.  Patient was given Versed and Haldol by EMS and cannot give any significant history here.  IVC paperwork was taken out by police on scene.  Will continue IVC paperwork.  Will put an order for psychiatry to evaluate.  Blood work is notable for elevated ethanol level.  ___________________________________________   FINAL CLINICAL IMPRESSION(S) / ED DIAGNOSES  Final diagnoses:  Abnormal behavior  Alcohol use     Note: This dictation was prepared with Dragon dictation. Any transcriptional errors that result from this process are unintentional     Phineas Semen, MD 01/31/20 2119

## 2020-02-01 DIAGNOSIS — F1994 Other psychoactive substance use, unspecified with psychoactive substance-induced mood disorder: Secondary | ICD-10-CM | POA: Insufficient documentation

## 2020-02-01 DIAGNOSIS — Z789 Other specified health status: Secondary | ICD-10-CM | POA: Insufficient documentation

## 2020-02-01 DIAGNOSIS — R4689 Other symptoms and signs involving appearance and behavior: Secondary | ICD-10-CM

## 2020-02-01 DIAGNOSIS — Z7289 Other problems related to lifestyle: Secondary | ICD-10-CM | POA: Insufficient documentation

## 2020-02-01 NOTE — ED Provider Notes (Signed)
-----------------------------------------   6:39 AM on 02/01/2020 -----------------------------------------   Blood pressure 127/79, pulse 93, temperature 97.8 F (36.6 C), temperature source Oral, resp. rate 18, height 1.702 m (5\' 7" ), weight 81 kg, SpO2 99 %.  The patient is calm and cooperative at this time.  She is now awake, alert, oriented, and clinically sober.  She is unclear about the details of last night but is acting completely calm and cooperative at this time.  Rashaun and Jasmine with psychiatry were able to obtain collateral from the patient's mother and she was very reassuring, has no safety concerns about the patient, and said that a panic attack was triggered in the setting of alcohol use but she supports with the patient is saying now that she is not a danger to herself or anyone else.  The patient is willing to contract for safety and the mother is going to come pick her up.  I am reassured upon reassessment that the patient is not a danger to herself or others and that she no longer meets criteria for involuntary commitment.  I rescinded the IVC order and will discharge her with recommendations for outpatient follow-up.  I gave my usual and customary return precautions.   , MD 02/01/20 [phone]

## 2020-02-01 NOTE — BH Assessment (Signed)
This writer attempted to contact pt's mother (April Levada Schilling [phone]) for collateral however there was no answer. This Clinical research associate left a HIPPA compliant message, requesting a call back.

## 2020-02-01 NOTE — ED Notes (Signed)
E-signature not working at this time. Pt verbalized understanding of D/C instructions, prescriptions and follow up care with no further questions at this time. Pt in NAD and ambulatory at time of D/C. Pt leaving with mother at time of discharge

## 2020-02-01 NOTE — ED Notes (Signed)
Rashaun, NP speaking with patient.

## 2020-02-01 NOTE — BH Assessment (Signed)
Unable to be assessed due to being medicated with versed and haldol for aggressive behaviors.

## 2020-02-01 NOTE — ED Notes (Signed)
Gray colored t-shirt, black colored bra, gray colored sweat pants placed in labeled belongings bag to be secured on the unit.
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
## 2020-02-01 NOTE — ED Notes (Signed)
Assumed care of patient.

## 2020-02-01 NOTE — ED Notes (Signed)
Pt up to restroom with EDT.  Pt requested and given water and VS taken at this time.  Pt back to bed and lie down.  Pt denies any other needs at this time.

## 2020-02-01 NOTE — Discharge Instructions (Signed)

## 2020-02-01 NOTE — ED Notes (Signed)
This RN contacted patient's mother to pick patient up. Mother to come pick patient up from emergency room.
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
## 2020-03-31 ENCOUNTER — Emergency Department: Payer: No Typology Code available for payment source

## 2020-03-31 ENCOUNTER — Other Ambulatory Visit: Payer: Self-pay

## 2020-03-31 ENCOUNTER — Emergency Department
Admission: EM | Admit: 2020-03-31 | Discharge: 2020-03-31 | Disposition: A | Discharge status: Home / Self Care | Payer: No Typology Code available for payment source | Attending: Emergency Medicine | Admitting: Emergency Medicine

## 2020-03-31 DIAGNOSIS — F17210 Nicotine dependence, cigarettes, uncomplicated: Secondary | ICD-10-CM | POA: Diagnosis not present

## 2020-03-31 DIAGNOSIS — Y92410 Unspecified street and highway as the place of occurrence of the external cause: Secondary | ICD-10-CM | POA: Insufficient documentation

## 2020-03-31 DIAGNOSIS — S29012A Strain of muscle and tendon of back wall of thorax, initial encounter: Secondary | ICD-10-CM | POA: Diagnosis not present

## 2020-03-31 DIAGNOSIS — S8991XA Unspecified injury of right lower leg, initial encounter: Secondary | ICD-10-CM | POA: Diagnosis present

## 2020-03-31 DIAGNOSIS — S8001XA Contusion of right knee, initial encounter: Secondary | ICD-10-CM | POA: Diagnosis not present

## 2020-03-31 MED ORDER — MELOXICAM 15 MG PO TABS: 15.0000 mg | ORAL_TABLET | Freq: Every day | ORAL | 0 refills | Status: DC
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
## 2021-01-06 ENCOUNTER — Encounter: Payer: Self-pay | Admitting: Physician Assistant

## 2021-01-06 ENCOUNTER — Emergency Department
Admission: EM | Admit: 2021-01-06 | Discharge: 2021-01-06 | Disposition: A | Discharge status: Home / Self Care | Payer: Self-pay | Attending: Emergency Medicine | Admitting: Emergency Medicine

## 2021-01-06 DIAGNOSIS — Z20822 Contact with and (suspected) exposure to covid-19: Secondary | ICD-10-CM | POA: Insufficient documentation

## 2021-01-06 DIAGNOSIS — J101 Influenza due to other identified influenza virus with other respiratory manifestations: Secondary | ICD-10-CM | POA: Insufficient documentation

## 2021-01-06 DIAGNOSIS — F17210 Nicotine dependence, cigarettes, uncomplicated: Secondary | ICD-10-CM | POA: Insufficient documentation

## 2021-01-06 LAB — RESP PANEL BY RT-PCR (FLU A&B, COVID) ARPGX2
Influenza A by PCR: POSITIVE — AB
Influenza B by PCR: NEGATIVE
SARS Coronavirus 2 by RT PCR: NEGATIVE

## 2021-01-06 MED ORDER — ONDANSETRON 4 MG PO TBDP: 4.0000 mg | ORAL_TABLET | Freq: Three times a day (TID) | ORAL | 0 refills | Status: AC | PRN

## 2021-01-06 MED ORDER — ACETAMINOPHEN 325 MG PO TABS
650.0000 mg | ORAL_TABLET | Freq: Once | ORAL | Status: AC
  Administered 2021-01-06: 650 mg via ORAL
  Filled 2021-01-06: qty 2

## 2021-01-06 MED ORDER — ONDANSETRON 4 MG PO TBDP
4.0000 mg | ORAL_TABLET | Freq: Once | ORAL | Status: AC
  Administered 2021-01-06: 4 mg via ORAL
  Filled 2021-01-06: qty 1

## 2021-01-06 MED ORDER — ONDANSETRON 4 MG PO TBDP: 4.0000 mg | ORAL_TABLET | Freq: Three times a day (TID) | ORAL | 0 refills | Status: DC | PRN

## 2021-01-06 MED ORDER — IBUPROFEN 800 MG PO TABS: 800.0000 mg | ORAL_TABLET | Freq: Once | ORAL | Status: DC

## 2021-01-06 NOTE — ED Notes (Signed)
Pt vomiting and endorsing diarrhea

## 2021-01-06 NOTE — Discharge Instructions (Signed)
You can take Zofran up to three times daily.  

## 2021-01-06 NOTE — ED Provider Notes (Signed)
[facility]EMERGENCY DEPARTMENT  ____________________________________________  Time seen: Approximately 9:17 PM  I have reviewed the triage vital signs and the nursing notes.   HISTORY  Chief Complaint Fever   Historian Patient     HPI Alicia Costa is a 25 y.o. female presents to the emergency department with fever, body aches and headache for the past 3 days.  Patient's children and mother are flu a positive.  No chest pain, chest tightness or abdominal pain.   Past Medical History:  Diagnosis Date   Pyelonephritis 07/28/2014     Immunizations up to date:  Yes.     Past Medical History:  Diagnosis Date   Pyelonephritis 07/28/2014    Patient Active Problem List   Diagnosis Date Noted   Abnormal behavior    Alcohol use    Substance induced mood disorder ([place])    Overdose 03/04/2017   Moderate recurrent major depression ([place]) 03/04/2017   Alcohol abuse 03/04/2017   Cocaine abuse ([place]) 03/04/2017   Hemorrhoids     Past Surgical History:  Procedure Laterality Date   EVALUATION UNDER ANESTHESIA WITH HEMORRHOIDECTOMY N/A 06/24/2016   Procedure: EXAM UNDER ANESTHESIA WITH HEMORRHOIDECTOMY;  Surgeon: Jules Husbands, MD;  Location: [facility] ORS;  Service: General;  Laterality: N/A;    Prior to Admission medications   Medication Sig Start Date End Date Taking? Authorizing Provider  ondansetron (ZOFRAN ODT) 4 MG disintegrating tablet Take 1 tablet (4 mg total) by mouth every 8 (eight) hours as needed for up to 5 days. 01/06/21 01/11/21 Yes Vallarie Mare M, PA-C  cephALEXin (KEFLEX) 500 MG capsule Take 1 capsule (500 mg total) by mouth 3 (three) times daily. 11/10/18   Johnn Hai, PA-C  docusate sodium (COLACE) 100 MG capsule Take 100 mg by mouth daily as needed for mild constipation.    [provider]  etonogestrel (NEXPLANON) 68 MG IMPL implant 1 each by Subdermal route once. 04/26/16   [provider]  ibuprofen (ADVIL) 600 MG tablet Take 1 tablet  (600 mg total) by mouth every 8 (eight) hours as needed. 11/29/18   Sable Feil, PA-C  meloxicam (MOBIC) 15 MG tablet Take 1 tablet (15 mg total) by mouth daily. 03/31/20   Cuthriell, Charline Bills, PA-C  sertraline (ZOLOFT) 25 MG tablet Take 25 mg by mouth daily. 05/23/16   [provider]    Allergies Penicillins  Family History  Problem Relation Age of Onset   Cancer Maternal Grandmother    Cancer Maternal Grandfather    Hypertension Paternal Grandmother    Diabetes Paternal Grandmother     Social History Social History   Tobacco Use   Smoking status: Every Day    Packs/day: 0.25    Types: Cigarettes   Smokeless tobacco: Never  Substance Use Topics   Alcohol use: Yes    Comment: everyweekend   Drug use: Yes    Frequency: 7.0 times per week    Types: Marijuana     Review of Systems  Constitutional: Patient has fever.  Eyes: No visual changes. No discharge ENT: Patient has congestion.  Cardiovascular: no chest pain. Respiratory: Patient has cough.  Gastrointestinal: No abdominal pain.  No nausea, no vomiting. Patient had diarrhea.  Genitourinary: Negative for dysuria. No hematuria Musculoskeletal: Patient has myalgias.  Skin: Negative for rash, abrasions, lacerations, ecchymosis. Neurological: Patient has headache, no focal weakness or numbness.    ____________________________________________   PHYSICAL EXAM:  VITAL SIGNS: ED Triage Vitals  Enc Vitals Group  BP 01/06/21 1943 (!) 178/113     Pulse Rate 01/06/21 1943 (!) 113     Resp 01/06/21 1943 18     Temp 01/06/21 1938 (!) 101.4 F (38.6 C)     Temp Source 01/06/21 2106 Oral     SpO2 01/06/21 1943 99 %     Weight 01/06/21 1939 160 lb (72.6 kg)     Height 01/06/21 1939 5\' 8"  (1.727 m)     Head Circumference --      Peak Flow --      Pain Score 01/06/21 1938 10     Pain Loc --      Pain Edu? --      Excl. in [place]? --      Constitutional: Alert and oriented. Patient is lying  supine. Eyes: Conjunctivae are normal. PERRL. EOMI. Head: Atraumatic. ENT:      Ears: Tympanic membranes are mildly injected with mild effusion bilaterally.       Nose: No congestion/rhinnorhea.      Mouth/Throat: Mucous membranes are moist. Posterior pharynx is mildly erythematous.  Hematological/Lymphatic/Immunilogical: No cervical lymphadenopathy.  Cardiovascular: Normal rate, regular rhythm. Normal S1 and S2.  Good peripheral circulation. Respiratory: Normal respiratory effort without tachypnea or retractions. Lungs CTAB. Good air entry to the bases with no decreased or absent breath sounds. Gastrointestinal: Bowel sounds 4 quadrants. Soft and nontender to palpation. No guarding or rigidity. No palpable masses. No distention. No CVA tenderness. Musculoskeletal: Full range of motion to all extremities. No gross deformities appreciated. Neurologic:  Normal speech and language. No gross focal neurologic deficits are appreciated.  Skin:  Skin is warm, dry and intact. No rash noted. Psychiatric: Mood and affect are normal. Speech and behavior are normal. Patient exhibits appropriate insight and judgement.   ____________________________________________   LABS (all labs ordered are listed, but only abnormal results are displayed)  Labs Reviewed  RESP PANEL BY RT-PCR (FLU A&B, COVID) ARPGX2 - Abnormal; Notable for the following components:      Result Value   Influenza A by PCR POSITIVE (*)    All other components within normal limits   ____________________________________________  EKG   ____________________________________________  RADIOLOGY   No results found.  ____________________________________________    PROCEDURES  Procedure(s) performed:     Procedures     Medications  ibuprofen (ADVIL) tablet 800 mg (has no administration in time range)  acetaminophen (TYLENOL) tablet 650 mg (650 mg Oral Given 01/06/21 2035)  ondansetron (ZOFRAN-ODT) disintegrating  tablet 4 mg (4 mg Oral Given 01/06/21 2006)     ____________________________________________   INITIAL IMPRESSION / ASSESSMENT AND PLAN / ED COURSE  Pertinent labs & imaging results that were available during my care of the patient were reviewed by me and considered in my medical decision making (see chart for details).      Assessment and plan Influenza A 25 year old female presents to the emergency department with fever, body aches and cough.  She tested positive for influenza A.  Declined Tamiflu.  Rest hydration were encouraged at home.     ____________________________________________  FINAL CLINICAL IMPRESSION(S) / ED DIAGNOSES  Final diagnoses:  Influenza A      NEW MEDICATIONS STARTED DURING THIS VISIT:  ED Discharge Orders          Ordered    ondansetron (ZOFRAN ODT) 4 MG disintegrating tablet  Every 8 hours PRN        01/06/21 2104  This chart was dictated using voice recognition software/Dragon. Despite best efforts to proofread, errors can occur which can change the meaning. Any change was purely unintentional.     Gasper Lloyd 01/06/21 2120    Sharman Cheek, MD 01/08/21 0006

## 2021-01-06 NOTE — ED Triage Notes (Signed)
Pt presents today with fever, headache and body aches x3 days.

## 2021-01-06 NOTE — ED Notes (Signed)
Dc ppw provided. At home meds reviewed. PT  denies any questions and provided signature for dc. Pt assisted off unit on foot with family. Dc vs refused  

## 2021-01-06 NOTE — ED Provider Notes (Signed)
[facility] Emergency Department Provider Note ____________________________________________  Time seen: 2019  I have reviewed the triage vital signs and the nursing notes.  HISTORY  Chief Complaint  Fever   HPI Alicia Costa is a 25 y.o. female presents to the ED with complaints of fever headache and back for the last 3 days.  She notes similar symptoms in her household contacts including her children.  Past Medical History:  Diagnosis Date   Pyelonephritis 07/28/2014    Patient Active Problem List   Diagnosis Date Noted   Abnormal behavior    Alcohol use    Substance induced mood disorder ([facility])    Overdose 03/04/2017   Moderate recurrent major depression ([facility]) 03/04/2017   Alcohol abuse 03/04/2017   Cocaine abuse ([facility]) 03/04/2017   Hemorrhoids     Past Surgical History:  Procedure Laterality Date   EVALUATION UNDER ANESTHESIA WITH HEMORRHOIDECTOMY N/A 06/24/2016   Procedure: EXAM UNDER ANESTHESIA WITH HEMORRHOIDECTOMY;  Surgeon: Leafy Ro, MD;  Location: [facility] ORS;  Service: General;  Laterality: N/A;    Prior to Admission medications   Medication Sig Start Date End Date Taking? Authorizing Provider  cephALEXin (KEFLEX) 500 MG capsule Take 1 capsule (500 mg total) by mouth 3 (three) times daily. 11/10/18   Tommi Rumps, PA-C  docusate sodium (COLACE) 100 MG capsule Take 100 mg by mouth daily as needed for mild constipation.    [provider]  etonogestrel (NEXPLANON) 68 MG IMPL implant 1 each by Subdermal route once. 04/26/16   [provider]  ibuprofen (ADVIL) 600 MG tablet Take 1 tablet (600 mg total) by mouth every 8 (eight) hours as needed. 11/29/18   Joni Reining, PA-C  meloxicam (MOBIC) 15 MG tablet Take 1 tablet (15 mg total) by mouth daily. 03/31/20   Cuthriell, Delorise Royals, PA-C  ondansetron (ZOFRAN ODT) 4 MG disintegrating tablet Take 1 tablet (4 mg total) by mouth every 8 (eight) hours as needed for up to 5  days. 01/06/21 01/11/21  Orvil Feil, PA-C  sertraline (ZOLOFT) 25 MG tablet Take 25 mg by mouth daily. 05/23/16   [provider]    Allergies Penicillins  Family History  Problem Relation Age of Onset   Cancer Maternal Grandmother    Cancer Maternal Grandfather    Hypertension Paternal Grandmother    Diabetes Paternal Grandmother     Social History Social History   Tobacco Use   Smoking status: Every Day    Packs/day: 0.25    Types: Cigarettes   Smokeless tobacco: Never  Substance Use Topics   Alcohol use: Yes    Comment: everyweekend   Drug use: Yes    Frequency: 7.0 times per week    Types: Marijuana    Review of Systems  Constitutional: Positive for fever. Eyes: Negative for visual changes. ENT: Negative for sore throat. Cardiovascular: Negative for chest pain. Respiratory: Negative for shortness of breath. Gastrointestinal: Negative for abdominal pain, vomiting and diarrhea. Genitourinary: Negative for dysuria. Musculoskeletal: Negative for back pain.  Reports generalized body aches Skin: Negative for rash. Neurological: Negative for headaches, focal weakness or numbness. ____________________________________________  PHYSICAL EXAM:  VITAL SIGNS: ED Triage Vitals  Enc Vitals Group     BP 01/06/21 1943 (!) 178/113     Pulse Rate 01/06/21 1943 (!) 113     Resp 01/06/21 1943 18     Temp 01/06/21 1938 (!) 101.4 F (38.6 C)     Temp src --  SpO2 01/06/21 1943 99 %     Weight 01/06/21 1939 160 lb (72.6 kg)     Height 01/06/21 1939 5\' 8"  (1.727 m)     Head Circumference --      Peak Flow --      Pain Score 01/06/21 1938 10     Pain Loc --      Pain Edu? --      Excl. in [place]? --     Constitutional: Alert and oriented. Well appearing and in no distress. Head: Normocephalic and atraumatic. Eyes: Conjunctivae are normal. PERRL. Normal extraocular movements Ears: Canals clear. TMs intact bilaterally. Nose: No  congestion/rhinorrhea/epistaxis. Mouth/Throat: Mucous membranes are moist. Neck: Supple. No thyromegaly. Hematological/Lymphatic/Immunological: No cervical lymphadenopathy. Cardiovascular: Normal rate, regular rhythm. Normal distal pulses. Respiratory: Normal respiratory effort. No wheezes/rales/rhonchi. Gastrointestinal: Soft and nontender. No distention. Musculoskeletal: Nontender with normal range of motion in all extremities.  Neurologic:  Normal gait without ataxia. Normal speech and language. No gross focal neurologic deficits are appreciated. Skin:  Skin is warm, dry and intact. No rash noted. Psychiatric: Mood and affect are normal. Patient exhibits appropriate insight and judgment. ____________________________________________    {LABS (pertinent positives/negatives)  Labs Reviewed  RESP PANEL BY RT-PCR (FLU A&B, COVID) ARPGX2 - Abnormal; Notable for the following components:      Result Value   Influenza A by PCR POSITIVE (*)    All other components within normal limits  ____________________________________________  {EKG  ____________________________________________   RADIOLOGY Official radiology report(s): No results found. ____________________________________________  PROCEDURES  Acetaminophen 650 mg PO Zofran 4 mg ODT  Procedures ____________________________________________   INITIAL IMPRESSION / ASSESSMENT AND PLAN / ED COURSE  As part of my medical decision making, I reviewed the following data within the electronic MEDICAL RECORD NUMBER Labs reviewed as noted and Notes from prior ED visits    DDX: covid, influenza, viral URI  Female patient with ED evaluation of fevers, body aches, malaise.  She is evaluated for complaints in ED, found to have a positive viral panel screen for influenza A.  Patient is beyond the treatment window for Tamiflu, and as such will be treated empirically with nausea medicine and over-the-counter cough and cold medicines.  Return  precautions of been reviewed.  Alicia Costa was evaluated in Emergency Department on 01/07/2021 for the symptoms described in the history of present illness. She was evaluated in the context of the global COVID-19 pandemic, which necessitated consideration that the patient might be at risk for infection with the SARS-CoV-2 virus that causes COVID-19. Institutional protocols and algorithms that pertain to the evaluation of patients at risk for COVID-19 are in a state of rapid change based on information released by regulatory bodies including the CDC and federal and state organizations. These policies and algorithms were followed during the patient's care in the ED. __________________________________________  FINAL CLINICAL IMPRESSION(S) / ED DIAGNOSES  Final diagnoses:  Influenza A      Kaymon Denomme, 13/01/2021, PA-C 01/07/21 0016    13/12/22, MD 01/16/21 1510

## 2021-02-09 ENCOUNTER — Emergency Department: Payer: No Typology Code available for payment source

## 2021-02-09 ENCOUNTER — Other Ambulatory Visit: Payer: Self-pay

## 2021-02-09 ENCOUNTER — Emergency Department
Admission: EM | Admit: 2021-02-09 | Discharge: 2021-02-09 | Disposition: A | Discharge status: Home / Self Care | Payer: No Typology Code available for payment source | Attending: Student in an Organized Health Care Education/Training Program | Admitting: Student in an Organized Health Care Education/Training Program

## 2021-02-09 ENCOUNTER — Encounter: Payer: Self-pay | Admitting: Intensive Care

## 2021-02-09 DIAGNOSIS — S8001XA Contusion of right knee, initial encounter: Secondary | ICD-10-CM | POA: Diagnosis not present

## 2021-02-09 DIAGNOSIS — M79604 Pain in right leg: Secondary | ICD-10-CM

## 2021-02-09 DIAGNOSIS — F17210 Nicotine dependence, cigarettes, uncomplicated: Secondary | ICD-10-CM | POA: Insufficient documentation

## 2021-02-09 DIAGNOSIS — S80811A Abrasion, right lower leg, initial encounter: Secondary | ICD-10-CM | POA: Diagnosis not present

## 2021-02-09 DIAGNOSIS — S0083XA Contusion of other part of head, initial encounter: Secondary | ICD-10-CM | POA: Diagnosis not present

## 2021-02-09 DIAGNOSIS — M25561 Pain in right knee: Secondary | ICD-10-CM | POA: Insufficient documentation

## 2021-02-09 DIAGNOSIS — S8991XA Unspecified injury of right lower leg, initial encounter: Secondary | ICD-10-CM | POA: Diagnosis present

## 2021-02-09 DIAGNOSIS — T07XXXA Unspecified multiple injuries, initial encounter: Secondary | ICD-10-CM

## 2021-02-09 DIAGNOSIS — N9489 Other specified conditions associated with female genital organs and menstrual cycle: Secondary | ICD-10-CM | POA: Insufficient documentation

## 2021-02-09 DIAGNOSIS — S40212A Abrasion of left shoulder, initial encounter: Secondary | ICD-10-CM | POA: Insufficient documentation

## 2021-02-09 DIAGNOSIS — Y92410 Unspecified street and highway as the place of occurrence of the external cause: Secondary | ICD-10-CM | POA: Insufficient documentation

## 2021-02-09 LAB — CBC WITH DIFFERENTIAL/PLATELET
Abs Immature Granulocytes: 0.03 10*3/uL (ref 0.00–0.07)
Basophils Absolute: 0 10*3/uL (ref 0.0–0.1)
Basophils Relative: 0 %
Eosinophils Absolute: 0 10*3/uL (ref 0.0–0.5)
Eosinophils Relative: 0 %
HCT: 33.8 % — ABNORMAL LOW (ref 36.0–46.0)
Hemoglobin: 11.1 g/dL — ABNORMAL LOW (ref 12.0–15.0)
Immature Granulocytes: 0 %
Lymphocytes Relative: 24 %
Lymphs Abs: 2.3 10*3/uL (ref 0.7–4.0)
MCH: 28 pg (ref 26.0–34.0)
MCHC: 32.8 g/dL (ref 30.0–36.0)
MCV: 85.4 fL (ref 80.0–100.0)
Monocytes Absolute: 0.7 10*3/uL (ref 0.1–1.0)
Monocytes Relative: 7 %
Neutro Abs: 6.6 10*3/uL (ref 1.7–7.7)
Neutrophils Relative %: 69 %
Platelets: 301 10*3/uL (ref 150–400)
RBC: 3.96 MIL/uL (ref 3.87–5.11)
RDW: 15 % (ref 11.5–15.5)
WBC: 9.6 10*3/uL (ref 4.0–10.5)
nRBC: 0 % (ref 0.0–0.2)

## 2021-02-09 LAB — BASIC METABOLIC PANEL
Anion gap: 9 (ref 5–15)
BUN: 9 mg/dL (ref 6–20)
CO2: 23 mmol/L (ref 22–32)
Calcium: 8.9 mg/dL (ref 8.9–10.3)
Chloride: 102 mmol/L (ref 98–111)
Creatinine, Ser: 0.64 mg/dL (ref 0.44–1.00)
GFR, Estimated: >60 mL/min (ref >60)
Glucose, Bld: 87 mg/dL (ref 70–99)
Potassium: 3.6 mmol/L (ref 3.5–5.1)
Sodium: 134 mmol/L — ABNORMAL LOW (ref 135–145)

## 2021-02-09 LAB — PROTIME-INR
INR: 0.9 (ref 0.8–1.2)
Prothrombin Time: 12.6 seconds (ref 11.4–15.2)

## 2021-02-09 LAB — HCG, QUANTITATIVE, PREGNANCY: hCG, Beta Chain, Quant, S: <1 m[IU]/mL (ref <5)

## 2021-02-09 MED ORDER — FENTANYL CITRATE PF 50 MCG/ML IJ SOSY
50.0000 ug | PREFILLED_SYRINGE | Freq: Once | INTRAMUSCULAR | Status: AC
  Administered 2021-02-09: 50 ug via INTRAVENOUS
  Filled 2021-02-09: qty 1

## 2021-02-09 MED ORDER — ONDANSETRON 4 MG PO TBDP
4.0000 mg | ORAL_TABLET | Freq: Once | ORAL | Status: AC | PRN
  Administered 2021-02-09: 4 mg via ORAL
  Filled 2021-02-09: qty 1

## 2021-02-09 MED ORDER — IOHEXOL 300 MG/ML  SOLN
100.0000 mL | Freq: Once | INTRAMUSCULAR | Status: AC | PRN
  Administered 2021-02-09: 100 mL via INTRAVENOUS
  Filled 2021-02-09: qty 100

## 2021-02-09 MED ORDER — OXYCODONE-ACETAMINOPHEN 5-325 MG PO TABS: 1.0000 | ORAL_TABLET | Freq: Four times a day (QID) | ORAL | 0 refills | Status: DC | PRN

## 2021-02-09 MED ORDER — OXYCODONE-ACETAMINOPHEN 5-325 MG PO TABS
1.0000 | ORAL_TABLET | ORAL | Status: DC | PRN
  Administered 2021-02-09: 1 via ORAL
  Filled 2021-02-09: qty 1

## 2021-02-09 MED ORDER — ONDANSETRON HCL 4 MG/2ML IJ SOLN
4.0000 mg | Freq: Once | INTRAMUSCULAR | Status: AC
  Administered 2021-02-09: 4 mg via INTRAVENOUS
  Filled 2021-02-09: qty 2

## 2021-02-09 NOTE — ED Notes (Signed)
Provider at bedside examining pt. 
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
## 2021-02-09 NOTE — Discharge Instructions (Addendum)
Follow-up with your primary care provider at [facility] if any continued problems.  Apply ice packs to your right knee frequently as you have multiple abrasions and contusions over your body.  Wear the knee immobilizer when you are up walking.  You do not have to wear it when you are sleeping.  Use the crutches to help with weightbearing.  A prescription for pain medication was sent to your pharmacy.  Do not take these medications and drive or operate machinery.  Also do not mix these medications with alcohol.  Call and make an appointment with Dr. Rosita Kea if you continue to have problems with your right knee for reevaluation and treatment.

## 2021-02-09 NOTE — ED Notes (Signed)
Placed purewick on pt to obtain POC urine test for CT scans.  Mother at bedside. Pt verbalizes some relief of pain from pain meds.
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
## 2021-02-09 NOTE — ED Notes (Signed)
Lab called to add on labs

## 2021-03-26 ENCOUNTER — Other Ambulatory Visit: Payer: Self-pay

## 2021-03-26 ENCOUNTER — Emergency Department: Payer: Self-pay

## 2021-03-26 ENCOUNTER — Emergency Department
Admission: EM | Admit: 2021-03-26 | Discharge: 2021-03-26 | Disposition: A | Discharge status: Home / Self Care | Payer: Self-pay | Attending: Emergency Medicine | Admitting: Emergency Medicine

## 2021-03-26 ENCOUNTER — Encounter: Payer: Self-pay | Admitting: Emergency Medicine

## 2021-03-26 DIAGNOSIS — W1849XA Other slipping, tripping and stumbling without falling, initial encounter: Secondary | ICD-10-CM | POA: Insufficient documentation

## 2021-03-26 DIAGNOSIS — M25562 Pain in left knee: Secondary | ICD-10-CM | POA: Insufficient documentation

## 2021-03-26 DIAGNOSIS — G8911 Acute pain due to trauma: Secondary | ICD-10-CM | POA: Insufficient documentation

## 2021-03-26 MED ORDER — ONDANSETRON HCL 4 MG/2ML IJ SOLN
4.0000 mg | Freq: Once | INTRAMUSCULAR | Status: AC
  Administered 2021-03-26: 4 mg via INTRAVENOUS
  Filled 2021-03-26: qty 2

## 2021-03-26 MED ORDER — MORPHINE SULFATE (PF) 4 MG/ML IV SOLN
4.0000 mg | Freq: Once | INTRAVENOUS | Status: AC
  Administered 2021-03-26: 4 mg via INTRAVENOUS
  Filled 2021-03-26: qty 1

## 2021-03-26 MED ORDER — KETOROLAC TROMETHAMINE 30 MG/ML IJ SOLN
30.0000 mg | Freq: Once | INTRAMUSCULAR | Status: AC
  Administered 2021-03-26: 30 mg via INTRAVENOUS
  Filled 2021-03-26: qty 1

## 2021-03-26 MED ORDER — NAPROXEN 500 MG PO TABS: 500.0000 mg | ORAL_TABLET | Freq: Two times a day (BID) | ORAL | 0 refills | Status: AC
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
## 2021-03-26 NOTE — Discharge Instructions (Addendum)
Call make an appoint with Dr. Odis Luster who is the orthopedist on-call.  You have had multiple issues with your knees and should be evaluated by an orthopedist.  Wear the knee immobilizer until you are able to stand without any pain.  The crutches are also to be used when walking and for added support.  Ice and elevate your knee as needed for discomfort.  Begin taking naproxen 500 mg twice daily with food.  Discontinue taking this medication if you experience any stomach upset or noticed any black tarry stools.

## 2021-03-26 NOTE — ED Notes (Signed)
Patient tolerated placement of the knee immobilizer poorly, crying and grabbing left leg. Patient dislodged her IV in the process and soiled the knee immobilizer. Another new immobilizer was fitted by ED tech.
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
## 2021-08-23 ENCOUNTER — Emergency Department
Admission: EM | Admit: 2021-08-23 | Discharge: 2021-08-23 | Disposition: A | Discharge status: Home / Self Care | Payer: Self-pay | Attending: Emergency Medicine | Admitting: Emergency Medicine

## 2021-08-23 ENCOUNTER — Emergency Department: Payer: Self-pay

## 2021-08-23 DIAGNOSIS — X501XXA Overexertion from prolonged static or awkward postures, initial encounter: Secondary | ICD-10-CM | POA: Insufficient documentation

## 2021-08-23 DIAGNOSIS — Y990 Civilian activity done for income or pay: Secondary | ICD-10-CM | POA: Insufficient documentation

## 2021-08-23 DIAGNOSIS — M25562 Pain in left knee: Secondary | ICD-10-CM | POA: Insufficient documentation

## 2021-08-23 MED ORDER — OXYCODONE HCL 5 MG PO TABS: 5.0000 mg | ORAL_TABLET | Freq: Three times a day (TID) | ORAL | 0 refills | Status: DC | PRN

## 2021-08-23 MED ORDER — KETOROLAC TROMETHAMINE 15 MG/ML IJ SOLN
15.0000 mg | Freq: Once | INTRAMUSCULAR | Status: AC
Start: 2021-08-23 — End: 2021-08-23
  Administered 2021-08-23: 15 mg via INTRAVENOUS
  Filled 2021-08-23: qty 1

## 2021-08-23 MED ORDER — OXYCODONE HCL 5 MG PO TABS: 5.0000 mg | ORAL_TABLET | Freq: Three times a day (TID) | ORAL | 0 refills | Status: AC | PRN

## 2021-08-23 MED ORDER — HYDROMORPHONE HCL 1 MG/ML IJ SOLN
1.0000 mg | Freq: Once | INTRAMUSCULAR | Status: AC
  Administered 2021-08-23: 1 mg via INTRAVENOUS
  Filled 2021-08-23: qty 1

## 2021-08-23 NOTE — Discharge Instructions (Signed)
Your x-ray did not show any broken bones.  You likely sprained the knee.  You should take 400 mg of ibuprofen every 6 hours.  You can take the oxycodone as needed for breakthrough pain.

## 2021-08-23 NOTE — ED Notes (Signed)
Rx sent to [facility], follow up ortho info provided , knee immobilized , all questions answered

## 2021-08-23 NOTE — ED Provider Notes (Signed)
[facility] Provider Note    Event Date/Time   First MD Initiated Contact with Patient 08/23/21 1454     (approximate)   History   Knee Injury (Twisted knee yesterday , this am took tylenol and unable to put pressure on knee, pt states 10/10 pain , 50 mcg fent given by ems )   HPI  Alicia Costa is a 26 y.o. female with no significant past medical history presents with left knee pain.  Patient was at work yesterday when she tripped.  She twisted the left knee.  She has been having trouble weightbearing since.  Has noted swelling.  Took ibuprofen and BC powder but pain has not resolved.  Pain is 10 out of 10 currently.  Denies numbness in the leg or foot.  No other injuries. 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 --      Excl. in GC? --     Most recent vital signs: Vitals:   08/23/21 1404 08/23/21 1538  BP: (!) 176/132 (!) 165/74  Pulse:  85  Resp:  16  Temp:  98.5 F (36.9 C)  SpO2:  98%     General: Awake, no distress.  CV:  Good peripheral perfusion.  Resp:  Normal effort.  Abd:  No distention.  Neuro:             Awake, Alert, Oriented x 3  Other:  Left  knee mildly swollen, no deformity, tenderness on the medial joint line and lateral joint line, patient refusing to straight leg raise Significant pain with passive flexion unable to test laxity due to patient's discomfort. 2+ DP pulse Sensation grossly intact in the foot and lower extremity   ED Results / Procedures / Treatments  Labs (all labs ordered are listed, but only abnormal results are displayed) Labs Reviewed - No data to display   EKG     RADIOLOGY X-rays left knee reviewed by myself is negative for fracture dislocation   PROCEDURES:  Critical Care performed: No  Procedures  The patient is on the cardiac monitor to evaluate for evidence of arrhythmia and/or significant heart rate changes.   MEDICATIONS ORDERED IN ED: Medications  HYDROmorphone (DILAUDID) injection 1 mg (1 mg Intravenous Given 08/23/21 1538)  ketorolac (TORADOL) 15 MG/ML injection 15 mg (15 mg Intravenous Given 08/23/21 1557)  IMPRESSION / MDM / ASSESSMENT AND PLAN / ED COURSE  I reviewed the triage vital signs and the nursing notes.                              Patient's presentation is most consistent with acute complicated illness / injury requiring diagnostic workup.  Differential diagnosis includes, but is not limited to, patella subluxation/dislocation, knee sprain, ACL tear, meniscal tear, less likely knee dislocation has reduced, compartment syndrome, quadriceps or patella tendon rupture  The patient is a 26 year old female presents with left knee pain after an injury yesterday that occurred at work.  She twisted the knee and has been having difficulty ambulating since.  The knee is slightly swollen and there is tenderness over both the medial lateral joint line patient is in significant discomfort and will not make me really range the knee at all she is refusing to straight leg raise.  She has good pulses good sensation and motor function.  No other signs of injuries.  X-ray obtained  which is negative for fracture dislocation.  Given the mechanism I have low suspicion for tibial plateau fracture.  Suspect knee sprain.  We will treat with IV opiates  Patient improved after Dilaudid.  On repeat assessment she is able to straight leg raise.  I performed a bedside ultrasound found quadriceps and patellar tendons are intact.  Will place in a knee immobilizer.  Suspect knee sprain.  Will refer to orthopedics.  Recommended NSAIDs RICE and will prescribe oxycodone for breakthrough pain.   FINAL CLINICAL IMPRESSION(S) / ED DIAGNOSES   Final diagnoses:  Acute pain of left knee     Rx / DC Orders   ED Discharge Orders          Ordered    oxyCODONE (ROXICODONE) 5 MG immediate release tablet  Every 8 hours PRN        08/23/21 1642             Note:  This document was prepared using Dragon voice recognition software and may include unintentional dictation errors.   Georga Hacking, MD 08/23/21 [phone]

## 2021-08-23 NOTE — ED Triage Notes (Signed)
Twisted knee yesterday , this am took tylenol and unable to put pressure on knee, pt states 10/10 pain , 50 mcg fent given by ems

## 2021-11-05 DIAGNOSIS — R600 Localized edema: Secondary | ICD-10-CM | POA: Diagnosis not present

## 2021-11-05 DIAGNOSIS — S83092A Other subluxation of left patella, initial encounter: Secondary | ICD-10-CM | POA: Diagnosis not present

## 2021-11-05 DIAGNOSIS — M25362 Other instability, left knee: Secondary | ICD-10-CM | POA: Diagnosis not present

## 2021-11-05 DIAGNOSIS — M25562 Pain in left knee: Secondary | ICD-10-CM | POA: Diagnosis not present

## 2021-11-05 DIAGNOSIS — M222X2 Patellofemoral disorders, left knee: Secondary | ICD-10-CM | POA: Diagnosis not present

## 2022-01-06 DIAGNOSIS — Z20822 Contact with and (suspected) exposure to covid-19: Secondary | ICD-10-CM | POA: Diagnosis not present

## 2022-01-06 DIAGNOSIS — Z112 Encounter for screening for other bacterial diseases: Secondary | ICD-10-CM | POA: Diagnosis not present

## 2022-01-06 DIAGNOSIS — J029 Acute pharyngitis, unspecified: Secondary | ICD-10-CM | POA: Diagnosis not present

## 2022-01-06 DIAGNOSIS — Z1389 Encounter for screening for other disorder: Secondary | ICD-10-CM | POA: Diagnosis not present

## 2022-01-06 DIAGNOSIS — Z1159 Encounter for screening for other viral diseases: Secondary | ICD-10-CM | POA: Diagnosis not present

## 2022-01-22 DIAGNOSIS — S86912A Strain of unspecified muscle(s) and tendon(s) at lower leg level, left leg, initial encounter: Secondary | ICD-10-CM | POA: Diagnosis not present

## 2022-01-23 DIAGNOSIS — S93512A Sprain of interphalangeal joint of left great toe, initial encounter: Secondary | ICD-10-CM | POA: Diagnosis not present

## 2022-01-23 DIAGNOSIS — S83242A Other tear of medial meniscus, current injury, left knee, initial encounter: Secondary | ICD-10-CM | POA: Diagnosis not present

## 2022-01-23 DIAGNOSIS — M23632 Other spontaneous disruption of medial collateral ligament of left knee: Secondary | ICD-10-CM | POA: Diagnosis not present

## 2022-01-23 DIAGNOSIS — F4310 Post-traumatic stress disorder, unspecified: Secondary | ICD-10-CM | POA: Diagnosis not present

## 2022-01-23 DIAGNOSIS — S86912A Strain of unspecified muscle(s) and tendon(s) at lower leg level, left leg, initial encounter: Secondary | ICD-10-CM | POA: Diagnosis not present

## 2022-01-23 DIAGNOSIS — M25462 Effusion, left knee: Secondary | ICD-10-CM | POA: Diagnosis not present

## 2022-01-23 DIAGNOSIS — M7122 Synovial cyst of popliteal space [Baker], left knee: Secondary | ICD-10-CM | POA: Diagnosis not present

## 2022-01-25 DIAGNOSIS — S83512A Sprain of anterior cruciate ligament of left knee, initial encounter: Secondary | ICD-10-CM | POA: Diagnosis not present

## 2022-03-13 DIAGNOSIS — S83232A Complex tear of medial meniscus, current injury, left knee, initial encounter: Secondary | ICD-10-CM | POA: Diagnosis not present

## 2022-03-13 DIAGNOSIS — S83512A Sprain of anterior cruciate ligament of left knee, initial encounter: Secondary | ICD-10-CM | POA: Diagnosis not present

## 2022-03-13 DIAGNOSIS — S83242A Other tear of medial meniscus, current injury, left knee, initial encounter: Secondary | ICD-10-CM | POA: Diagnosis not present

## 2022-03-13 DIAGNOSIS — G8918 Other acute postprocedural pain: Secondary | ICD-10-CM | POA: Diagnosis not present

## 2022-03-16 DIAGNOSIS — M25562 Pain in left knee: Secondary | ICD-10-CM | POA: Diagnosis not present

## 2022-03-16 DIAGNOSIS — M25662 Stiffness of left knee, not elsewhere classified: Secondary | ICD-10-CM | POA: Diagnosis not present

## 2022-03-19 DIAGNOSIS — M25562 Pain in left knee: Secondary | ICD-10-CM | POA: Diagnosis not present

## 2022-03-19 DIAGNOSIS — M25662 Stiffness of left knee, not elsewhere classified: Secondary | ICD-10-CM | POA: Diagnosis not present

## 2022-03-23 DIAGNOSIS — M25562 Pain in left knee: Secondary | ICD-10-CM | POA: Diagnosis not present

## 2022-03-23 DIAGNOSIS — M25662 Stiffness of left knee, not elsewhere classified: Secondary | ICD-10-CM | POA: Diagnosis not present

## 2022-05-21 DIAGNOSIS — M25662 Stiffness of left knee, not elsewhere classified: Secondary | ICD-10-CM | POA: Diagnosis not present

## 2022-05-21 DIAGNOSIS — M25562 Pain in left knee: Secondary | ICD-10-CM | POA: Diagnosis not present

## 2022-07-02 DIAGNOSIS — R300 Dysuria: Secondary | ICD-10-CM | POA: Diagnosis not present

## 2022-07-02 DIAGNOSIS — M25562 Pain in left knee: Secondary | ICD-10-CM | POA: Diagnosis not present

## 2022-07-02 DIAGNOSIS — Z0131 Encounter for examination of blood pressure with abnormal findings: Secondary | ICD-10-CM | POA: Diagnosis not present

## 2022-07-02 DIAGNOSIS — Z1389 Encounter for screening for other disorder: Secondary | ICD-10-CM | POA: Diagnosis not present

## 2022-07-02 DIAGNOSIS — I10 Essential (primary) hypertension: Secondary | ICD-10-CM | POA: Diagnosis not present

## 2023-01-30 DIAGNOSIS — Z1159 Encounter for screening for other viral diseases: Secondary | ICD-10-CM | POA: Diagnosis not present

## 2023-01-30 DIAGNOSIS — Z0131 Encounter for examination of blood pressure with abnormal findings: Secondary | ICD-10-CM | POA: Diagnosis not present

## 2023-01-30 DIAGNOSIS — F418 Other specified anxiety disorders: Secondary | ICD-10-CM | POA: Diagnosis not present

## 2023-01-30 DIAGNOSIS — Z113 Encounter for screening for infections with a predominantly sexual mode of transmission: Secondary | ICD-10-CM | POA: Diagnosis not present

## 2023-01-30 DIAGNOSIS — Z1389 Encounter for screening for other disorder: Secondary | ICD-10-CM | POA: Diagnosis not present

## 2023-02-14 DIAGNOSIS — Z309 Encounter for contraceptive management, unspecified: Secondary | ICD-10-CM | POA: Diagnosis not present

## 2023-02-14 DIAGNOSIS — F4310 Post-traumatic stress disorder, unspecified: Secondary | ICD-10-CM | POA: Diagnosis not present

## 2023-02-14 DIAGNOSIS — Z0131 Encounter for examination of blood pressure with abnormal findings: Secondary | ICD-10-CM | POA: Diagnosis not present

## 2023-02-14 DIAGNOSIS — I10 Essential (primary) hypertension: Secondary | ICD-10-CM | POA: Diagnosis not present

## 2023-02-14 DIAGNOSIS — A599 Trichomoniasis, unspecified: Secondary | ICD-10-CM | POA: Diagnosis not present

## 2023-02-14 DIAGNOSIS — Z1331 Encounter for screening for depression: Secondary | ICD-10-CM | POA: Diagnosis not present

## 2023-02-14 DIAGNOSIS — Z1389 Encounter for screening for other disorder: Secondary | ICD-10-CM | POA: Diagnosis not present

## 2023-03-05 DIAGNOSIS — F411 Generalized anxiety disorder: Secondary | ICD-10-CM | POA: Diagnosis not present

## 2023-04-02 DIAGNOSIS — F411 Generalized anxiety disorder: Secondary | ICD-10-CM | POA: Diagnosis not present

## 2023-10-23 ENCOUNTER — Ambulatory Visit

## 2023-11-03 DIAGNOSIS — F411 Generalized anxiety disorder: Secondary | ICD-10-CM | POA: Diagnosis not present

## 2023-11-10 DIAGNOSIS — F411 Generalized anxiety disorder: Secondary | ICD-10-CM | POA: Diagnosis not present

## 2023-11-12 DIAGNOSIS — F411 Generalized anxiety disorder: Secondary | ICD-10-CM | POA: Diagnosis not present
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
## 2023-12-22 DIAGNOSIS — F411 Generalized anxiety disorder: Secondary | ICD-10-CM | POA: Diagnosis not present

## 2023-12-29 DIAGNOSIS — F411 Generalized anxiety disorder: Secondary | ICD-10-CM | POA: Diagnosis not present

## 2024-01-05 DIAGNOSIS — F411 Generalized anxiety disorder: Secondary | ICD-10-CM | POA: Diagnosis not present

## 2024-01-12 DIAGNOSIS — F411 Generalized anxiety disorder: Secondary | ICD-10-CM | POA: Diagnosis not present

## 2024-01-19 DIAGNOSIS — F411 Generalized anxiety disorder: Secondary | ICD-10-CM | POA: Diagnosis not present

## 2024-01-26 DIAGNOSIS — F411 Generalized anxiety disorder: Secondary | ICD-10-CM | POA: Diagnosis not present

## 2024-02-02 DIAGNOSIS — F411 Generalized anxiety disorder: Secondary | ICD-10-CM | POA: Diagnosis not present

## 2024-02-09 DIAGNOSIS — F411 Generalized anxiety disorder: Secondary | ICD-10-CM | POA: Diagnosis not present

## 2024-02-13 DIAGNOSIS — K5900 Constipation, unspecified: Secondary | ICD-10-CM | POA: Diagnosis not present

## 2024-02-13 DIAGNOSIS — Z0131 Encounter for examination of blood pressure with abnormal findings: Secondary | ICD-10-CM | POA: Diagnosis not present

## 2024-02-13 DIAGNOSIS — Z1389 Encounter for screening for other disorder: Secondary | ICD-10-CM | POA: Diagnosis not present

## 2024-02-13 DIAGNOSIS — F4310 Post-traumatic stress disorder, unspecified: Secondary | ICD-10-CM | POA: Diagnosis not present

## 2024-02-13 DIAGNOSIS — Z113 Encounter for screening for infections with a predominantly sexual mode of transmission: Secondary | ICD-10-CM | POA: Diagnosis not present

## 2024-02-13 DIAGNOSIS — Z1159 Encounter for screening for other viral diseases: Secondary | ICD-10-CM | POA: Diagnosis not present

## 2024-02-13 DIAGNOSIS — Z3201 Encounter for pregnancy test, result positive: Secondary | ICD-10-CM | POA: Diagnosis not present

## 2024-02-13 DIAGNOSIS — Z0130 Encounter for examination of blood pressure without abnormal findings: Secondary | ICD-10-CM | POA: Diagnosis not present

## 2024-02-13 DIAGNOSIS — O219 Vomiting of pregnancy, unspecified: Secondary | ICD-10-CM | POA: Diagnosis not present

## 2024-02-13 DIAGNOSIS — Z3200 Encounter for pregnancy test, result unknown: Secondary | ICD-10-CM | POA: Diagnosis not present
